# Patient Record
Sex: Male | Born: 1970 | Race: White | Hispanic: No | Marital: Single | Smoking: Current every day smoker
Health system: Southern US, Community
[De-identification: ages and names within clinical notes are randomized; demographics above are authoritative.]

## PROBLEM LIST (undated history)

## (undated) DIAGNOSIS — F329 Major depressive disorder, single episode, unspecified: Secondary | ICD-10-CM

## (undated) DIAGNOSIS — F419 Anxiety disorder, unspecified: Secondary | ICD-10-CM

## (undated) DIAGNOSIS — F319 Bipolar disorder, unspecified: Secondary | ICD-10-CM

## (undated) DIAGNOSIS — F32A Depression, unspecified: Secondary | ICD-10-CM

## (undated) HISTORY — PX: HAND SURGERY: SHX662

## (undated) HISTORY — PX: NECK SURGERY: SHX720

---

## 2014-08-11 ENCOUNTER — Emergency Department (HOSPITAL_COMMUNITY)
Admission: EM | Admit: 2014-08-11 | Discharge: 2014-08-11 | Discharge status: Against Medical Advice | Payer: Self-pay | Attending: Emergency Medicine | Admitting: Emergency Medicine

## 2014-08-11 ENCOUNTER — Encounter (HOSPITAL_COMMUNITY): Payer: Self-pay | Admitting: Emergency Medicine

## 2014-08-11 DIAGNOSIS — F1010 Alcohol abuse, uncomplicated: Secondary | ICD-10-CM | POA: Insufficient documentation

## 2014-08-11 DIAGNOSIS — R45851 Suicidal ideations: Secondary | ICD-10-CM | POA: Insufficient documentation

## 2014-08-11 DIAGNOSIS — F17200 Nicotine dependence, unspecified, uncomplicated: Secondary | ICD-10-CM | POA: Insufficient documentation

## 2014-08-11 LAB — CBC
HCT: 42.4 % (ref 39.0–52.0)
Hemoglobin: 15.1 g/dL (ref 13.0–17.0)
MCH: 30.9 pg (ref 26.0–34.0)
MCHC: 35.6 g/dL (ref 30.0–36.0)
MCV: 86.9 fL (ref 78.0–100.0)
Platelets: 251 10*3/uL (ref 150–400)
RBC: 4.88 MIL/uL (ref 4.22–5.81)
RDW: 12.8 % (ref 11.5–15.5)
WBC: 5.6 10*3/uL (ref 4.0–10.5)

## 2014-08-11 LAB — RAPID URINE DRUG SCREEN, HOSP PERFORMED
Amphetamines: NOT DETECTED
Barbiturates: NOT DETECTED
Benzodiazepines: NOT DETECTED
COCAINE: NOT DETECTED
OPIATES: NOT DETECTED
TETRAHYDROCANNABINOL: NOT DETECTED

## 2014-08-11 LAB — ETHANOL: ALCOHOL ETHYL (B): 353 mg/dL — AB (ref 0–11)

## 2014-08-11 LAB — COMPREHENSIVE METABOLIC PANEL
ALT: 8 U/L (ref 0–53)
AST: 17 U/L (ref 0–37)
Albumin: 4.1 g/dL (ref 3.5–5.2)
Alkaline Phosphatase: 105 U/L (ref 39–117)
Anion gap: 15 (ref 5–15)
BUN: 8 mg/dL (ref 6–23)
CALCIUM: 8.8 mg/dL (ref 8.4–10.5)
CO2: 24 mEq/L (ref 19–32)
CREATININE: 0.95 mg/dL (ref 0.50–1.35)
Chloride: 104 mEq/L (ref 96–112)
GFR calc non Af Amer: >90 mL/min (ref >90)
Glucose, Bld: 98 mg/dL (ref 70–99)
Potassium: 3.9 mEq/L (ref 3.7–5.3)
Sodium: 143 mEq/L (ref 137–147)
TOTAL PROTEIN: 7.7 g/dL (ref 6.0–8.3)
Total Bilirubin: 0.3 mg/dL (ref 0.3–1.2)

## 2014-08-11 NOTE — ED Notes (Signed)
While interviewing pt he stated that he did not want to be here and is not ready to do this  Pt states "I just want to go in the woods and drink myself to death"  Pt repeats he does not want to be here

## 2014-08-11 NOTE — ED Notes (Signed)
Pt not in room at this time

## 2014-08-11 NOTE — ED Notes (Signed)
Pt unable to void at this time. 

## 2014-08-11 NOTE — ED Notes (Signed)
Pt not in room x 3  

## 2014-08-11 NOTE — ED Notes (Signed)
Pt not in room x2 

## 2014-08-11 NOTE — ED Notes (Signed)
Pt states he is an alcoholic and is here for detox  Pt states he has been sober for 90 days and relapsed 3 days ago  Pt states he last drank earlier today

## 2014-08-12 ENCOUNTER — Encounter (HOSPITAL_COMMUNITY): Payer: Self-pay | Admitting: Emergency Medicine

## 2014-08-12 ENCOUNTER — Emergency Department (HOSPITAL_COMMUNITY)
Admission: EM | Admit: 2014-08-12 | Discharge: 2014-08-13 | Disposition: A | Discharge status: Home / Self Care | Payer: Self-pay | Attending: Emergency Medicine | Admitting: Emergency Medicine

## 2014-08-12 DIAGNOSIS — F329 Major depressive disorder, single episode, unspecified: Secondary | ICD-10-CM | POA: Insufficient documentation

## 2014-08-12 DIAGNOSIS — Z79899 Other long term (current) drug therapy: Secondary | ICD-10-CM | POA: Insufficient documentation

## 2014-08-12 DIAGNOSIS — F17200 Nicotine dependence, unspecified, uncomplicated: Secondary | ICD-10-CM | POA: Insufficient documentation

## 2014-08-12 DIAGNOSIS — F39 Unspecified mood [affective] disorder: Secondary | ICD-10-CM | POA: Insufficient documentation

## 2014-08-12 DIAGNOSIS — R45851 Suicidal ideations: Secondary | ICD-10-CM | POA: Insufficient documentation

## 2014-08-12 DIAGNOSIS — F10939 Alcohol use, unspecified with withdrawal, unspecified: Secondary | ICD-10-CM | POA: Insufficient documentation

## 2014-08-12 DIAGNOSIS — Z09 Encounter for follow-up examination after completed treatment for conditions other than malignant neoplasm: Secondary | ICD-10-CM

## 2014-08-12 DIAGNOSIS — Z9289 Personal history of other medical treatment: Secondary | ICD-10-CM

## 2014-08-12 DIAGNOSIS — F10239 Alcohol dependence with withdrawal, unspecified: Secondary | ICD-10-CM | POA: Insufficient documentation

## 2014-08-12 DIAGNOSIS — F411 Generalized anxiety disorder: Secondary | ICD-10-CM | POA: Insufficient documentation

## 2014-08-12 DIAGNOSIS — F3289 Other specified depressive episodes: Secondary | ICD-10-CM | POA: Insufficient documentation

## 2014-08-12 HISTORY — DX: Depression, unspecified: F32.A

## 2014-08-12 HISTORY — DX: Major depressive disorder, single episode, unspecified: F32.9

## 2014-08-12 LAB — COMPREHENSIVE METABOLIC PANEL
ALT: 10 U/L (ref 0–53)
ANION GAP: 18 — AB (ref 5–15)
AST: 21 U/L (ref 0–37)
Albumin: 4.5 g/dL (ref 3.5–5.2)
Alkaline Phosphatase: 117 U/L (ref 39–117)
BUN: 8 mg/dL (ref 6–23)
CALCIUM: 9.3 mg/dL (ref 8.4–10.5)
CO2: 25 meq/L (ref 19–32)
CREATININE: 0.9 mg/dL (ref 0.50–1.35)
Chloride: 102 mEq/L (ref 96–112)
GLUCOSE: 102 mg/dL — AB (ref 70–99)
Potassium: 4 mEq/L (ref 3.7–5.3)
SODIUM: 145 meq/L (ref 137–147)
TOTAL PROTEIN: 8.1 g/dL (ref 6.0–8.3)
Total Bilirubin: 0.4 mg/dL (ref 0.3–1.2)

## 2014-08-12 LAB — CBC
HCT: 45.3 % (ref 39.0–52.0)
HEMOGLOBIN: 16 g/dL (ref 13.0–17.0)
MCH: 31.4 pg (ref 26.0–34.0)
MCHC: 35.3 g/dL (ref 30.0–36.0)
MCV: 89 fL (ref 78.0–100.0)
Platelets: 256 10*3/uL (ref 150–400)
RBC: 5.09 MIL/uL (ref 4.22–5.81)
RDW: 12.9 % (ref 11.5–15.5)
WBC: 6.6 10*3/uL (ref 4.0–10.5)

## 2014-08-12 LAB — RAPID URINE DRUG SCREEN, HOSP PERFORMED
Amphetamines: NOT DETECTED
BARBITURATES: NOT DETECTED
BENZODIAZEPINES: NOT DETECTED
COCAINE: NOT DETECTED
OPIATES: NOT DETECTED
Tetrahydrocannabinol: NOT DETECTED

## 2014-08-12 LAB — ETHANOL: Alcohol, Ethyl (B): 194 mg/dL — ABNORMAL HIGH (ref 0–11)

## 2014-08-12 LAB — ACETAMINOPHEN LEVEL: Acetaminophen (Tylenol), Serum: <15 ug/mL (ref 10–30)

## 2014-08-12 LAB — SALICYLATE LEVEL

## 2014-08-12 MED ORDER — LORAZEPAM 1 MG PO TABS
0.0000 mg | ORAL_TABLET | Freq: Four times a day (QID) | ORAL | Status: DC
  Administered 2014-08-12 (×2): 2 mg via ORAL
  Administered 2014-08-13: 1 mg via ORAL
  Administered 2014-08-13: 2 mg via ORAL
  Administered 2014-08-13: 1 mg via ORAL
  Filled 2014-08-12 (×2): qty 2
  Filled 2014-08-12: qty 1
  Filled 2014-08-12: qty 2
  Filled 2014-08-12: qty 1

## 2014-08-12 MED ORDER — NICOTINE 21 MG/24HR TD PT24
21.0000 mg | MEDICATED_PATCH | Freq: Every day | TRANSDERMAL | Status: DC
  Administered 2014-08-12: 21 mg via TRANSDERMAL
  Filled 2014-08-12: qty 1

## 2014-08-12 MED ORDER — ONDANSETRON 4 MG PO TBDP
8.0000 mg | ORAL_TABLET | ORAL | Status: DC | PRN
  Administered 2014-08-12 (×2): 8 mg via ORAL
  Filled 2014-08-12 (×3): qty 2

## 2014-08-12 MED ORDER — LORAZEPAM 1 MG PO TABS: 0.0000 mg | ORAL_TABLET | Freq: Two times a day (BID) | ORAL | Status: DC

## 2014-08-12 NOTE — ED Notes (Signed)
Edward CheekPeyton Najjar [phone] (minister acquaintance).

## 2014-08-12 NOTE — ED Notes (Signed)
Per pt, seen yesterday for depression and SI.  D/C home.  Pt here today with same.  Had thoughts of hurting self last night.  Roommates told him to come into hospital.  Denies plan

## 2014-08-12 NOTE — ED Notes (Signed)
Patient denies SI, HI, AVH at present. Reports TH, tremor, anxiety, nausea. Rates anxiety 10/10, depression 8/10.  Encouragement offered. Given Ativan, Zofran, Gatorade, Water.  Q 15 safety checks continue.

## 2014-08-12 NOTE — ED Provider Notes (Signed)
CSN: [id number]     Arrival date & time 08/12/14  1523 History   First MD Initiated Contact with Patient 08/12/14 1621     Chief Complaint  Patient presents with  . Suicidal     (Consider location/radiation/quality/duration/timing/severity/associated sxs/prior Treatment) The history is provided by the patient.  pt w hx etoh abuse, anxiety, and depression, c/o worsening depression in the past few weeks. Denies specific stressor. States is also drinking heavier than normal, daily. States when stops drinking will feel shaky. Denies hx complicated etoh withdrawal or dts. States physical health at baseline, no recent illness, or new symptoms or pain. Compliant w normal meds but states he doesn't feel they help. Gets mental health via [facility], but not there recently. States he recently bough duct tape and hose with intent to commit suicide by CO poisoning, however friends intervened before he could follow through on plan.     History reviewed. No pertinent past medical history. Past Surgical History  Procedure Laterality Date  . Neck surgery    . Hand surgery     History reviewed. No pertinent family history. History  Substance Use Topics  . Smoking status: Current Every Day Smoker    Types: Cigarettes  . Smokeless tobacco: Not on file  . Alcohol Use: Yes    Review of Systems  Constitutional: Negative for fever.  HENT: Negative for sore throat.   Eyes: Negative for redness.  Respiratory: Negative for shortness of breath.   Cardiovascular: Negative for chest pain.  Gastrointestinal: Negative for abdominal pain.  Genitourinary: Negative for flank pain.  Musculoskeletal: Negative for back pain and neck pain.  Skin: Negative for rash.  Neurological: Negative for headaches.  Hematological: Does not bruise/bleed easily.  Psychiatric/Behavioral: Positive for suicidal ideas and dysphoric mood. Negative for confusion and self-injury. The patient is nervous/anxious.       Allergies   Review of patient's allergies indicates no known allergies.  Home Medications   Prior to Admission medications   Medication Sig Start Date End Date Taking? Authorizing Provider  lamoTRIgine (LAMICTAL) 150 MG tablet Take 150 mg by mouth daily.   Yes Historical Provider, MD  traZODone (DESYREL) 50 MG tablet Take 50 mg by mouth at bedtime.   Yes Historical Provider, MD   BP 145/87  Pulse 103  Temp(Src) 98.5 F (36.9 C) (Oral)  Resp 18  SpO2 97% Physical Exam  Nursing note and vitals reviewed. Constitutional: He is oriented to person, place, and time. He appears well-developed and well-nourished. No distress.  HENT:  Head: Atraumatic.  Mouth/Throat: Oropharynx is clear and moist.  Eyes: Conjunctivae are normal. Pupils are equal, round, and reactive to light. No scleral icterus.  Neck: Neck supple. No tracheal deviation present.  Cardiovascular: Normal rate, regular rhythm, normal heart sounds and intact distal pulses.   Pulmonary/Chest: Effort normal and breath sounds normal. No accessory muscle usage. No respiratory distress.  Abdominal: Soft. Bowel sounds are normal. He exhibits no distension. There is no tenderness.  Musculoskeletal: Normal range of motion. He exhibits no edema and no tenderness.  Neurological: He is alert and oriented to person, place, and time.  Steady gait, no tremor or shakes.   Skin: Skin is warm and dry. He is not diaphoretic.  Psychiatric:  Depressed mood, +SI w plan.     ED Course  Procedures (including critical care time) Labs Review  Results for orders placed during the hospital encounter of 08/12/14  CBC      Result Value Ref Range  WBC 6.6  4.0 - 10.5 K/uL   RBC 5.09  4.22 - 5.81 MIL/uL   Hemoglobin 16.0  13.0 - 17.0 g/dL   HCT 09.8  11.9 - 14.7 %   MCV 89.0  78.0 - 100.0 fL   MCH 31.4  26.0 - 34.0 pg   MCHC 35.3  30.0 - 36.0 g/dL   RDW 82.9  56.2 - 13.0 %   Platelets 256  150 - 400 K/uL  COMPREHENSIVE METABOLIC PANEL      Result Value  Ref Range   Sodium 145  137 - 147 mEq/L   Potassium 4.0  3.7 - 5.3 mEq/L   Chloride 102  96 - 112 mEq/L   CO2 25  19 - 32 mEq/L   Glucose, Bld 102 (*) 70 - 99 mg/dL   BUN 8  6 - 23 mg/dL   Creatinine, Ser 8.65  0.50 - 1.35 mg/dL   Calcium 9.3  8.4 - 78.4 mg/dL   Total Protein 8.1  6.0 - 8.3 g/dL   Albumin 4.5  3.5 - 5.2 g/dL   AST 21  0 - 37 U/L   ALT 10  0 - 53 U/L   Alkaline Phosphatase 117  39 - 117 U/L   Total Bilirubin 0.4  0.3 - 1.2 mg/dL   GFR calc non Af Amer >90  >90 mL/min   GFR calc Af Amer >90  >90 mL/min   Anion gap 18 (*) 5 - 15  URINE RAPID DRUG SCREEN (HOSP PERFORMED)      Result Value Ref Range   Opiates NONE DETECTED  NONE DETECTED   Cocaine NONE DETECTED  NONE DETECTED   Benzodiazepines NONE DETECTED  NONE DETECTED   Amphetamines NONE DETECTED  NONE DETECTED   Tetrahydrocannabinol NONE DETECTED  NONE DETECTED   Barbiturates NONE DETECTED  NONE DETECTED  ETHANOL      Result Value Ref Range   Alcohol, Ethyl (B) 194 (*) 0 - 11 mg/dL  ACETAMINOPHEN LEVEL      Result Value Ref Range   Acetaminophen (Tylenol), Serum <15.0  10 - 30 ug/mL  SALICYLATE LEVEL      Result Value Ref Range   Salicylate Lvl <2.0 (*) 2.8 - 20.0 mg/dL      MDM  Labs.  Pt moved to psych ED.  dispo per psych team - I anticipate patient will require inpatient psych treatment.  Reviewed nursing notes and prior charts for additional history.   Recheck, pt remains medically stable await psych team eval and dispo.    Suzi Roots, MD 08/12/14 1726

## 2014-08-13 ENCOUNTER — Observation Stay (HOSPITAL_COMMUNITY): Admission: AD | Admit: 2014-08-13 | Payer: Self-pay | Source: Intra-hospital | Admitting: Psychiatry

## 2014-08-13 ENCOUNTER — Encounter (HOSPITAL_COMMUNITY): Payer: Self-pay | Admitting: *Deleted

## 2014-08-13 DIAGNOSIS — Z09 Encounter for follow-up examination after completed treatment for conditions other than malignant neoplasm: Secondary | ICD-10-CM

## 2014-08-13 DIAGNOSIS — Z9289 Personal history of other medical treatment: Secondary | ICD-10-CM

## 2014-08-13 MED ORDER — LAMOTRIGINE 150 MG PO TABS
150.0000 mg | ORAL_TABLET | Freq: Every day | ORAL | Status: DC
  Administered 2014-08-13: 150 mg via ORAL
  Filled 2014-08-13: qty 1

## 2014-08-13 NOTE — ED Notes (Signed)
Update from Emily,CSW, that patient is being reviewed by RTS.

## 2014-08-13 NOTE — BH Assessment (Signed)
[facility] Assessment Progress Note  Pt accepted to [facility] per Lourdes Sledge, Rn. Pt will be transported by [employer] transportation.  Shuvon Rankin, NP agrees with disposition.  Call report number is [phone].

## 2014-08-13 NOTE — BH Assessment (Signed)
Tele Assessment Note   Edward Mcclain is a 43 y.o. male who voluntarily presents to [facility] with SI/SA and depression.  Pt came to emerg dept on 08/11/14 for for detox and stated he didn't receive any help.  Pt says that today he was having thoughts of harming himself and is friends told him to come to the ED for help.  Pt tells this Clinical research associate that he no longer feels SI and is requesting detox.  Pt reports drinking 3-1/5's and 1-12pk of alcohol, daily.  Pt last drink was 08/12/14, he states he consumed 2-24oz beers.  Pt denies seizures/blackouts, he says he has hallucinations during detox.  Pt c/o w/d sxs: tremors and nausea.        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 Depression    Axis IV: other psychosocial or environmental problems, problems related to social environment and problems with primary support group Axis V: 41-50 serious symptoms  Past Medical History:  Past Medical History  Diagnosis Date  . Depression     Past Surgical History  Procedure Laterality Date  . Neck surgery    . Hand surgery      Family History: History reviewed. No pertinent family history.  Social History:  reports that he has been smoking Cigarettes.  He has been smoking about 0.00 packs per day. He does not have any smokeless tobacco history on file. He reports that he drinks alcohol. He reports that he does not use illicit drugs.  Additional Social History:  Alcohol / Drug Use Pain Medications: None  Prescriptions: See MAR  Over the Counter: None  History of alcohol / drug use?: Yes Longest period of sobriety (when/how long): Only when in detox  Negative Consequences of Use: Work / School;Personal relationships;Financial Withdrawal Symptoms: Nausea / Vomiting;Tremors Substance #1 Name of Substance 1: Alcohol  1 - Age of First Use: 12 YOM  1 - Amount (size/oz): 3-1/5's & 1-12pk  1 - Frequency: Daily  1 -  Duration: On-going  1 - Last Use / Amount: 08/12/14  CIWA: CIWA-Ar BP: 130/84 mmHg Pulse Rate: 91 Nausea and Vomiting: 3 Tactile Disturbances: none Tremor: three Auditory Disturbances: not present Paroxysmal Sweats: three Visual Disturbances: not present Anxiety: two Headache, Fullness in Head: none present Agitation: somewhat more than normal activity Orientation and Clouding of Sensorium: oriented and can do serial additions CIWA-Ar Total: 12 COWS:    PATIENT STRENGTHS: (choose at least two) Communication skills Motivation for treatment/growth  Allergies: No Known Allergies  Home Medications:  (Not in a hospital admission)  OB/GYN Status:  No LMP for male patient.  General Assessment Data Location of Assessment: [facility] Is this a Tele or Face-to-Face Assessment?: Face-to-Face Is this an Initial Assessment or a Re-assessment for this encounter?: Initial Assessment Living Arrangements: Non-relatives/Friends (Lives with roommates ) Can pt return to current living arrangement?: Yes Admission Status: Voluntary Is patient capable of signing voluntary admission?: Yes Transfer from: Acute Hospital Referral Source: MD  Medical Screening Exam [facility] Walk-in ONLY) Medical Exam completed: No Reason for MSE not completed: Other: (None )  [facility] Crisis Care Plan Living Arrangements: Non-relatives/Friends (Lives with roommates ) Name of Psychiatrist: None  Name of Therapist: None   Education Status Is patient currently in school?: No Current Grade: None Highest grade of school patient has completed: None  Name of school: None  Contact person: None   Risk to self with  the past 6 months Suicidal Ideation: No-Not Currently/Within Last 6 Months Suicidal Intent: No-Not Currently/Within Last 6 Months Is patient at risk for suicide?: No Suicidal Plan?: No-Not Currently/Within Last 6 Months Access to Means: Yes Specify Access to Suicidal Means: Sharps, Hose(CO2 poisoning) What has  been your use of drugs/alcohol within the last 12 months?: Abusing: alcohol  Previous Attempts/Gestures: No How many times?: 0 Other Self Harm Risks: None  Triggers for Past Attempts: None known Intentional Self Injurious Behavior: None Family Suicide History: No Recent stressful life event(s): Other (Comment) (Chronic alcohol use ) Persecutory voices/beliefs?: No Depression: Yes Depression Symptoms: Loss of interest in usual pleasures Substance abuse history and/or treatment for substance abuse?: Yes Suicide prevention information given to non-admitted patients: Not applicable  Risk to Others within the past 6 months Homicidal Ideation: No Thoughts of Harm to Others: No Current Homicidal Intent: No Current Homicidal Plan: No Access to Homicidal Means: No Identified Victim: None  History of harm to others?: No Assessment of Violence: None Noted Violent Behavior Description: None  Does patient have access to weapons?: No Criminal Charges Pending?: No Does patient have a court date: No  Psychosis Hallucinations: None noted Delusions: None noted  Mental Status Report Appear/Hygiene: Disheveled;In scrubs Eye Contact: Fair Motor Activity: Tremors Speech: Logical/coherent Level of Consciousness: Alert Mood: Depressed Affect: Depressed Anxiety Level: None Thought Processes: Coherent;Relevant Judgement: Unimpaired Orientation: Person;Place;Time;Situation Obsessive Compulsive Thoughts/Behaviors: None  Cognitive Functioning Concentration: Normal Memory: Recent Intact;Remote Intact IQ: Average Insight: Fair Impulse Control: Fair Appetite: Fair Weight Loss: 0 Weight Gain: 0 Sleep: Decreased Total Hours of Sleep: 5 Vegetative Symptoms: None  ADLScreening [facility] Assessment Services) Patient's cognitive ability adequate to safely complete daily activities?: Yes Patient able to express need for assistance with ADLs?: Yes Independently performs ADLs?: Yes (appropriate for  developmental age)  Prior Inpatient Therapy Prior Inpatient Therapy: Yes Prior Therapy Dates: 30's, 2000, 2015 Prior Therapy Facilty/Provider(s): [facility], [facility], [facility]  Reason for Treatment: Detox/Rehab   Prior Outpatient Therapy Prior Outpatient Therapy: No Prior Therapy Dates: None  Prior Therapy Facilty/Provider(s): None  Reason for Treatment: None   ADL Screening (condition at time of admission) Patient's cognitive ability adequate to safely complete daily activities?: Yes Is the patient deaf or have difficulty hearing?: No Does the patient have difficulty seeing, even when wearing glasses/contacts?: No Does the patient have difficulty concentrating, remembering, or making decisions?: No Patient able to express need for assistance with ADLs?: Yes Does the patient have difficulty dressing or bathing?: No Independently performs ADLs?: Yes (appropriate for developmental age) Does the patient have difficulty walking or climbing stairs?: No Weakness of Legs: None Weakness of Arms/Hands: None  Home Assistive Devices/Equipment Home Assistive Devices/Equipment: None  Therapy Consults (therapy consults require a physician order) PT Evaluation Needed: No OT Evalulation Needed: No SLP Evaluation Needed: No Abuse/Neglect Assessment (Assessment to be complete while patient is alone) Physical Abuse: Denies Verbal Abuse: Denies Sexual Abuse: Denies Exploitation of patient/patient's resources: Denies Self-Neglect: Denies Values / Beliefs Cultural Requests During Hospitalization: None Spiritual Requests During Hospitalization: None Consults Spiritual Care Consult Needed: No Social Work Consult Needed: No Merchant navy officer (For Healthcare) Does patient have an advance directive?: No Would patient like information on creating an advanced directive?: No - patient declined information Nutrition Screen- [facility] Adult/[facility] Patient's home diet: Regular  Additional  Information 1:1 In Past 12 Months?: No CIRT Risk: No Elopement Risk: No Does patient have medical clearance?: Yes     Disposition:  Disposition Initial Assessment Completed  for this Encounter: Yes Disposition of Patient: Inpatient treatment program (No beds avail.  TTS will need to seek placement ) Type of inpatient treatment program: Adult Patient referred to: Other (Comment) (No beds avail.  TSS will need to seek placement )  Murrell Redden 08/13/2014 4:44 AM

## 2014-08-13 NOTE — Consult Note (Signed)
  Patient states that he wants help with detox from alcohol.  Patient states that he drinks fifth [place] Rose (4 bottles a day).  Patient denies history of seizures, suicidal/homicidal ideation, psychosis, and paranoia.  Patient states that his last drink was  Yesterday morning.    Agree with TTS assessment Recommend 24 hour observation  Patient was accepted to [facility] Observation Unit Bed 5  Monitor safety and stabilization until transfer.  Shuvon B. Rankin FNP-BC

## 2014-08-13 NOTE — Progress Notes (Signed)
[facility] Community Liaison Stacy,  ° °Provided pt with a [facility] Orange Card application, highlighting [facility], to help patient establish primary care.  °

## 2014-08-19 NOTE — Consult Note (Signed)
Face to face evaluation and I agree with this note 

## 2014-10-27 ENCOUNTER — Emergency Department (HOSPITAL_COMMUNITY)
Admission: EM | Admit: 2014-10-27 | Discharge: 2014-10-28 | Disposition: A | Discharge status: Other Acute Inpatient Hospital | Payer: Self-pay | Attending: Emergency Medicine | Admitting: Emergency Medicine

## 2014-10-27 DIAGNOSIS — F10920 Alcohol use, unspecified with intoxication, uncomplicated: Secondary | ICD-10-CM | POA: Insufficient documentation

## 2014-10-27 DIAGNOSIS — F329 Major depressive disorder, single episode, unspecified: Secondary | ICD-10-CM | POA: Insufficient documentation

## 2014-10-27 DIAGNOSIS — F1010 Alcohol abuse, uncomplicated: Secondary | ICD-10-CM

## 2014-10-27 DIAGNOSIS — Z79899 Other long term (current) drug therapy: Secondary | ICD-10-CM | POA: Insufficient documentation

## 2014-10-27 MED ORDER — NICOTINE 21 MG/24HR TD PT24
21.0000 mg | MEDICATED_PATCH | Freq: Once | TRANSDERMAL | Status: DC
  Administered 2014-10-28: 21 mg via TRANSDERMAL
  Filled 2014-10-27: qty 1

## 2014-10-27 NOTE — ED Notes (Signed)
Pt ambulatory to bathroom without assistance.

## 2014-10-27 NOTE — ED Notes (Signed)
Unable to verify any information due to pt's intoxication status

## 2014-10-27 NOTE — ED Provider Notes (Signed)
CSN: [id number]     Arrival date & time 10/27/14  1910 History   First MD Initiated Contact with Patient 10/27/14 2229     Chief Complaint  Patient presents with  . Alcohol Intoxication   (Consider location/radiation/quality/duration/timing/severity/associated sxs/prior Treatment) HPI  Edward Mcclain is a 43 yo male requesting detox from ETOH.  He reports he lives at a halfway house and because of his alcohol use he was told he can no longer live there unless he is detoxed.  He reports drinking a case of beer daily for "years".  His last drink was today a few hours PTA.  He last went through a 28 day detox program 5 months ago but he began drinking again shortly after release from that program. He reports smoking a pack of cigarettes a day but denies any recreational drug use.  He denies any SI/HI and denies any fevers, nausea, vomiting, chest pain, cough or shortness of breath   Past Medical History  Diagnosis Date  . Depression    Past Surgical History  Procedure Laterality Date  . Neck surgery    . Hand surgery     No family history on file. History  Substance Use Topics  . Smoking status: Current Every Day Smoker    Types: Cigarettes  . Smokeless tobacco: Not on file  . Alcohol Use: Yes     Comment: Daily Use     Review of Systems  Constitutional: Negative for fever and chills.  HENT: Negative for sore throat.   Eyes: Negative for visual disturbance.  Respiratory: Negative for cough and shortness of breath.   Cardiovascular: Negative for chest pain and leg swelling.  Gastrointestinal: Negative for nausea, vomiting and diarrhea.  Genitourinary: Negative for dysuria.  Musculoskeletal: Negative for myalgias.  Skin: Negative for rash.  Neurological: Negative for weakness, numbness and headaches.    Allergies  Review of patient's allergies indicates no known allergies.  Home Medications   Prior to Admission medications   Medication Sig Start Date End Date Taking?  Authorizing Provider  lamoTRIgine (LAMICTAL) 100 MG tablet Take 100 mg by mouth daily.   Yes Historical Provider, MD  traZODone (DESYREL) 150 MG tablet Take by mouth at bedtime.   Yes Historical Provider, MD   BP 139/94 mmHg  Pulse 104  Temp(Src) 98.1 F (36.7 C) (Oral)  Resp 20  SpO2 99% Physical Exam  Constitutional: He appears well-developed and well-nourished. No distress.  HENT:  Head: Normocephalic and atraumatic.  Mouth/Throat: Oropharynx is clear and moist. No oropharyngeal exudate.  Eyes: Pupils are equal, round, and reactive to light. Right conjunctiva is injected. Left conjunctiva is injected.  Neck: Neck supple. No thyromegaly present.  Cardiovascular: Normal rate, regular rhythm and intact distal pulses.   Pulmonary/Chest: Effort normal and breath sounds normal. No respiratory distress. He has no wheezes. He has no rales. He exhibits no tenderness.  Abdominal: Soft. There is no tenderness.  Musculoskeletal: He exhibits no tenderness.  Lymphadenopathy:    He has no cervical adenopathy.  Neurological: He is alert.  Skin: Skin is warm and dry. No rash noted. He is not diaphoretic.  Psychiatric: He has a normal mood and affect.  Nursing note and vitals reviewed.   ED Course  Procedures (including critical care time) Labs Review Labs Reviewed  CBC WITH DIFFERENTIAL - Abnormal; Notable for the following:    Hemoglobin 17.6 (*)    Neutrophils Relative % 32 (*)    Lymphocytes Relative 57 (*)    All  other components within normal limits  URINE RAPID DRUG SCREEN (HOSP PERFORMED)  COMPREHENSIVE METABOLIC PANEL  ETHANOL  ACETAMINOPHEN LEVEL  SALICYLATE LEVEL   Imaging Review No results found.   EKG Interpretation None      MDM   Final diagnoses:  ETOH abuse   Pt is alert and oriented, his speech is slightly slurred and there is the odor of ETOH, but he can answer questions appropriately, speak in full sentences and walk in the ED and is requesting ETOH detox.    Patient has been medically cleared in the ED and is awaiting consult by TTS team for possible placement vs OP clinic information. Pt is currently not having SI or HI and appears stable in NAD. [facility]   Filed Vitals:   10/27/14 1945 10/27/14 2235  BP: 152/106 139/94  Pulse: 106 104  Temp: 98.1 F (36.7 C)   TempSrc: Oral   Resp: 20   SpO2: 100% 99%   Meds given in ED:  Medications  nicotine (NICODERM CQ - dosed in mg/24 hours) patch 21 mg (21 mg Transdermal Patch Applied 10/28/14 0003)  acetaminophen (TYLENOL) tablet 650 mg (not administered)  ibuprofen (ADVIL,MOTRIN) tablet 600 mg (not administered)  ondansetron (ZOFRAN) tablet 4 mg (not administered)  zolpidem (AMBIEN) tablet 5 mg (not administered)  LORazepam (ATIVAN) tablet 1 mg (not administered)    New Prescriptions   No medications on file        Harle BattiestElizabeth Madisun Hargrove, NP 10/28/14 0041  Arby BarretteMarcy Pfeiffer, MD 10/28/14 [phone]

## 2014-10-27 NOTE — ED Notes (Signed)
Pt is intoxicated  Pt states he does not know how he got here or why he is here  When asked what we could do for him tonight pt laughed and said "I don't know"

## 2014-10-28 ENCOUNTER — Observation Stay (HOSPITAL_COMMUNITY)
Admission: AD | Admit: 2014-10-28 | Discharge: 2014-10-29 | Disposition: A | Discharge status: Home / Self Care | Payer: Self-pay | Source: Intra-hospital | Attending: Psychiatry | Admitting: Psychiatry

## 2014-10-28 ENCOUNTER — Encounter (HOSPITAL_COMMUNITY): Payer: Self-pay | Admitting: *Deleted

## 2014-10-28 DIAGNOSIS — F10239 Alcohol dependence with withdrawal, unspecified: Principal | ICD-10-CM | POA: Insufficient documentation

## 2014-10-28 DIAGNOSIS — Z09 Encounter for follow-up examination after completed treatment for conditions other than malignant neoplasm: Secondary | ICD-10-CM

## 2014-10-28 DIAGNOSIS — F10929 Alcohol use, unspecified with intoxication, unspecified: Secondary | ICD-10-CM

## 2014-10-28 DIAGNOSIS — Z79899 Other long term (current) drug therapy: Secondary | ICD-10-CM | POA: Insufficient documentation

## 2014-10-28 DIAGNOSIS — F319 Bipolar disorder, unspecified: Secondary | ICD-10-CM | POA: Insufficient documentation

## 2014-10-28 DIAGNOSIS — Z9289 Personal history of other medical treatment: Secondary | ICD-10-CM

## 2014-10-28 DIAGNOSIS — F10920 Alcohol use, unspecified with intoxication, uncomplicated: Secondary | ICD-10-CM

## 2014-10-28 DIAGNOSIS — F10230 Alcohol dependence with withdrawal, uncomplicated: Secondary | ICD-10-CM | POA: Insufficient documentation

## 2014-10-28 DIAGNOSIS — F419 Anxiety disorder, unspecified: Secondary | ICD-10-CM | POA: Insufficient documentation

## 2014-10-28 DIAGNOSIS — F1020 Alcohol dependence, uncomplicated: Secondary | ICD-10-CM | POA: Diagnosis present

## 2014-10-28 HISTORY — DX: Bipolar disorder, unspecified: F31.9

## 2014-10-28 HISTORY — DX: Anxiety disorder, unspecified: F41.9

## 2014-10-28 LAB — ACETAMINOPHEN LEVEL: Acetaminophen (Tylenol), Serum: <15 ug/mL (ref 10–30)

## 2014-10-28 LAB — CBC WITH DIFFERENTIAL/PLATELET
Basophils Absolute: 0 10*3/uL (ref 0.0–0.1)
Basophils Relative: 1 % (ref 0–1)
Eosinophils Absolute: 0.2 10*3/uL (ref 0.0–0.7)
Eosinophils Relative: 4 % (ref 0–5)
HCT: 50.1 % (ref 39.0–52.0)
HEMOGLOBIN: 17.6 g/dL — AB (ref 13.0–17.0)
LYMPHS ABS: 3.8 10*3/uL (ref 0.7–4.0)
Lymphocytes Relative: 57 % — ABNORMAL HIGH (ref 12–46)
MCH: 31.6 pg (ref 26.0–34.0)
MCHC: 35.1 g/dL (ref 30.0–36.0)
MCV: 89.9 fL (ref 78.0–100.0)
MONOS PCT: 6 % (ref 3–12)
Monocytes Absolute: 0.4 10*3/uL (ref 0.1–1.0)
NEUTROS PCT: 32 % — AB (ref 43–77)
Neutro Abs: 2.1 10*3/uL (ref 1.7–7.7)
Platelets: 359 10*3/uL (ref 150–400)
RBC: 5.57 MIL/uL (ref 4.22–5.81)
RDW: 12.9 % (ref 11.5–15.5)
WBC: 6.6 10*3/uL (ref 4.0–10.5)

## 2014-10-28 LAB — COMPREHENSIVE METABOLIC PANEL
ALK PHOS: 102 U/L (ref 39–117)
ALT: 13 U/L (ref 0–53)
ANION GAP: 15 (ref 5–15)
AST: 21 U/L (ref 0–37)
Albumin: 4.4 g/dL (ref 3.5–5.2)
BUN: 7 mg/dL (ref 6–23)
CHLORIDE: 106 meq/L (ref 96–112)
CO2: 29 mEq/L (ref 19–32)
Calcium: 9.4 mg/dL (ref 8.4–10.5)
Creatinine, Ser: 0.91 mg/dL (ref 0.50–1.35)
GLUCOSE: 111 mg/dL — AB (ref 70–99)
POTASSIUM: 4.2 meq/L (ref 3.7–5.3)
Sodium: 150 mEq/L — ABNORMAL HIGH (ref 137–147)
Total Bilirubin: 0.3 mg/dL (ref 0.3–1.2)
Total Protein: 8.5 g/dL — ABNORMAL HIGH (ref 6.0–8.3)

## 2014-10-28 LAB — RAPID URINE DRUG SCREEN, HOSP PERFORMED
Amphetamines: NOT DETECTED
Barbiturates: NOT DETECTED
Benzodiazepines: NOT DETECTED
Cocaine: NOT DETECTED
Opiates: NOT DETECTED
Tetrahydrocannabinol: NOT DETECTED

## 2014-10-28 LAB — ETHANOL
ALCOHOL ETHYL (B): 219 mg/dL — AB (ref 0–11)
Alcohol, Ethyl (B): 399 mg/dL — ABNORMAL HIGH (ref 0–11)

## 2014-10-28 LAB — SALICYLATE LEVEL

## 2014-10-28 MED ORDER — CHLORDIAZEPOXIDE HCL 25 MG PO CAPS: 25.0000 mg | ORAL_CAPSULE | Freq: Four times a day (QID) | ORAL | Status: DC | PRN

## 2014-10-28 MED ORDER — CHLORDIAZEPOXIDE HCL 25 MG PO CAPS
25.0000 mg | ORAL_CAPSULE | Freq: Four times a day (QID) | ORAL | Status: DC | PRN
  Administered 2014-10-28: 25 mg via ORAL
  Filled 2014-10-28: qty 1

## 2014-10-28 MED ORDER — CHLORDIAZEPOXIDE HCL 25 MG PO CAPS: 25.0000 mg | ORAL_CAPSULE | Freq: Four times a day (QID) | ORAL | Status: DC

## 2014-10-28 MED ORDER — ZOLPIDEM TARTRATE 5 MG PO TABS: 5.0000 mg | ORAL_TABLET | Freq: Every evening | ORAL | Status: DC | PRN

## 2014-10-28 MED ORDER — VITAMIN B-1 100 MG PO TABS
100.0000 mg | ORAL_TABLET | Freq: Every day | ORAL | Status: DC
  Administered 2014-10-29: 100 mg via ORAL
  Filled 2014-10-28 (×3): qty 1

## 2014-10-28 MED ORDER — ADULT MULTIVITAMIN W/MINERALS CH: 1.0000 | ORAL_TABLET | Freq: Every day | ORAL | Status: DC

## 2014-10-28 MED ORDER — LORAZEPAM 1 MG PO TABS
1.0000 mg | ORAL_TABLET | Freq: Three times a day (TID) | ORAL | Status: DC | PRN
  Administered 2014-10-28: 1 mg via ORAL
  Filled 2014-10-28: qty 1

## 2014-10-28 MED ORDER — SODIUM CHLORIDE 0.9 % IV BOLUS (SEPSIS)
1000.0000 mL | Freq: Once | INTRAVENOUS | Status: AC
  Administered 2014-10-28: 1000 mL via INTRAVENOUS

## 2014-10-28 MED ORDER — CHLORDIAZEPOXIDE HCL 25 MG PO CAPS: 25.0000 mg | ORAL_CAPSULE | Freq: Every day | ORAL | Status: DC

## 2014-10-28 MED ORDER — THIAMINE HCL 100 MG/ML IJ SOLN: 100.0000 mg | Freq: Once | INTRAMUSCULAR | Status: DC

## 2014-10-28 MED ORDER — ACETAMINOPHEN 325 MG PO TABS: 650.0000 mg | ORAL_TABLET | Freq: Four times a day (QID) | ORAL | Status: DC | PRN

## 2014-10-28 MED ORDER — HALOPERIDOL 5 MG PO TABS: 5.0000 mg | ORAL_TABLET | Freq: Four times a day (QID) | ORAL | Status: DC | PRN

## 2014-10-28 MED ORDER — LORAZEPAM 2 MG/ML IJ SOLN: 0.0000 mg | Freq: Four times a day (QID) | INTRAMUSCULAR | Status: DC

## 2014-10-28 MED ORDER — HYDROXYZINE HCL 25 MG PO TABS: 25.0000 mg | ORAL_TABLET | Freq: Four times a day (QID) | ORAL | Status: DC | PRN

## 2014-10-28 MED ORDER — ONDANSETRON 4 MG PO TBDP: 4.0000 mg | ORAL_TABLET | Freq: Four times a day (QID) | ORAL | Status: DC | PRN

## 2014-10-28 MED ORDER — ONDANSETRON 4 MG PO TBDP
4.0000 mg | ORAL_TABLET | Freq: Four times a day (QID) | ORAL | Status: DC | PRN
  Administered 2014-10-28: 4 mg via ORAL
  Filled 2014-10-28: qty 1

## 2014-10-28 MED ORDER — CHLORDIAZEPOXIDE HCL 25 MG PO CAPS
25.0000 mg | ORAL_CAPSULE | Freq: Three times a day (TID) | ORAL | Status: DC
Start: 2014-10-30 — End: 2014-10-30

## 2014-10-28 MED ORDER — ACETAMINOPHEN 325 MG PO TABS: 650.0000 mg | ORAL_TABLET | ORAL | Status: DC | PRN

## 2014-10-28 MED ORDER — CHLORDIAZEPOXIDE HCL 25 MG PO CAPS: 25.0000 mg | ORAL_CAPSULE | Freq: Three times a day (TID) | ORAL | Status: DC

## 2014-10-28 MED ORDER — CHLORDIAZEPOXIDE HCL 25 MG PO CAPS
25.0000 mg | ORAL_CAPSULE | Freq: Four times a day (QID) | ORAL | Status: AC
  Administered 2014-10-28 – 2014-10-29 (×5): 25 mg via ORAL
  Filled 2014-10-28 (×4): qty 1

## 2014-10-28 MED ORDER — THIAMINE HCL 100 MG/ML IJ SOLN
Freq: Once | INTRAVENOUS | Status: AC
  Administered 2014-10-28: 03:00:00 via INTRAVENOUS
  Filled 2014-10-28: qty 1000

## 2014-10-28 MED ORDER — BENZTROPINE MESYLATE 1 MG PO TABS: 1.0000 mg | ORAL_TABLET | Freq: Four times a day (QID) | ORAL | Status: DC | PRN

## 2014-10-28 MED ORDER — CHLORDIAZEPOXIDE HCL 25 MG PO CAPS: 25.0000 mg | ORAL_CAPSULE | ORAL | Status: DC

## 2014-10-28 MED ORDER — ONDANSETRON HCL 4 MG PO TABS: 4.0000 mg | ORAL_TABLET | Freq: Three times a day (TID) | ORAL | Status: DC | PRN

## 2014-10-28 MED ORDER — TRAZODONE HCL 50 MG PO TABS
50.0000 mg | ORAL_TABLET | Freq: Every evening | ORAL | Status: DC | PRN
  Administered 2014-10-28: 50 mg via ORAL
  Filled 2014-10-28 (×5): qty 1

## 2014-10-28 MED ORDER — LAMOTRIGINE 100 MG PO TABS
150.0000 mg | ORAL_TABLET | Freq: Every day | ORAL | Status: DC
  Administered 2014-10-28 – 2014-10-29 (×2): 150 mg via ORAL
  Filled 2014-10-28 (×2): qty 1.5
  Filled 2014-10-28: qty 21
  Filled 2014-10-28 (×2): qty 2
  Filled 2014-10-28: qty 1.5

## 2014-10-28 MED ORDER — LORAZEPAM 2 MG/ML IJ SOLN: 0.0000 mg | Freq: Two times a day (BID) | INTRAMUSCULAR | Status: DC

## 2014-10-28 MED ORDER — ALUM & MAG HYDROXIDE-SIMETH 200-200-20 MG/5ML PO SUSP: 30.0000 mL | ORAL | Status: DC | PRN

## 2014-10-28 MED ORDER — LOPERAMIDE HCL 2 MG PO CAPS: 2.0000 mg | ORAL_CAPSULE | ORAL | Status: DC | PRN

## 2014-10-28 MED ORDER — CHLORDIAZEPOXIDE HCL 25 MG PO CAPS
25.0000 mg | ORAL_CAPSULE | Freq: Every day | ORAL | Status: DC
Start: 2014-11-01 — End: 2014-10-28

## 2014-10-28 MED ORDER — HYDROXYZINE HCL 25 MG PO TABS
25.0000 mg | ORAL_TABLET | Freq: Four times a day (QID) | ORAL | Status: DC | PRN
  Administered 2014-10-28: 25 mg via ORAL
  Filled 2014-10-28: qty 1

## 2014-10-28 MED ORDER — VITAMIN B-1 100 MG PO TABS: 100.0000 mg | ORAL_TABLET | Freq: Every day | ORAL | Status: DC

## 2014-10-28 MED ORDER — MAGNESIUM HYDROXIDE 400 MG/5ML PO SUSP: 30.0000 mL | Freq: Every day | ORAL | Status: DC | PRN

## 2014-10-28 MED ORDER — IBUPROFEN 200 MG PO TABS: 600.0000 mg | ORAL_TABLET | Freq: Three times a day (TID) | ORAL | Status: DC | PRN

## 2014-10-28 MED ORDER — ADULT MULTIVITAMIN W/MINERALS CH
1.0000 | ORAL_TABLET | Freq: Every day | ORAL | Status: DC
  Administered 2014-10-28 – 2014-10-29 (×2): 1 via ORAL
  Filled 2014-10-28: qty 14
  Filled 2014-10-28 (×5): qty 1

## 2014-10-28 MED ORDER — NICOTINE 21 MG/24HR TD PT24
21.0000 mg | MEDICATED_PATCH | Freq: Every day | TRANSDERMAL | Status: DC
  Administered 2014-10-28 – 2014-10-29 (×2): 21 mg via TRANSDERMAL
  Filled 2014-10-28 (×6): qty 1

## 2014-10-28 NOTE — Progress Notes (Signed)
Patient ID: Edward HeadsRobert Mcclain, male   DOB: May 31, 1971, 43 y.o.   MRN: [id number] D: Patient alert and cooperative. Pt detoxing from EOTH and c/o tremors, nausea, and anxiety. Pt mood/affect is anxious and sad. Pt denies SI/HI/AVH. No acute distressed noted at this time.   A: Medications administered as prescribed. Emotional support given and will continue to monitor pt's progress for stabilization.  R: Patient remains safe and complaint with medications.

## 2014-10-28 NOTE — Progress Notes (Signed)
Patient ID: Edward HeadsRobert Mcclain, male   DOB: 06/26/1971, 43 y.o.   MRN: [id number] 43 year old Caucasian [facility].  Patient states he has been drinking since the age of 43 with his longest period of sobriety being 14 months.  [facility] and was asked to leave when they discovered he had been drinking.  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  Patient had lab work done prior to coming over and his alcohol level was still elevated.  He smelled quite strongly of alcohol.  He was cooperative with the interview process.  He is not married, has no children, but states his parents and siblings are his support systems.  Is familiar with AA and has been to some meetings in the past, but never had a sponsor.  Admits that when he has tried to stop drinking on his own he has a rough time with sweats and shaking.  He would like to detox and go back to his sober living home.

## 2014-10-28 NOTE — ED Provider Notes (Signed)
Called regarding patient's sodium level.  Behavioral Health [facility]  His hypernatremia was likely secondary to dehydration.  He has had IV fluids and is tolerating PO intake since that test was initially drawn.  I think he is stable for further psychiatric care.     Warnell Foresterrey Felisa Zechman, MD 10/28/14 1153

## 2014-10-28 NOTE — ED Provider Notes (Signed)
Edward Mcclain with [facility] contacted the ED in regards to Mr. Edward Mcclain with a concern for extremely elevated alcohol level and history of DTs, seizures from alcohol withdrawal. The patient was reporting symptoms of restlessness and irritability and expressed concerns that this is how withdrawal felt to him.   The patient will be placed on CIWA protocol and kept in [facility]. IV fluids provided. Will observe for any sign of withdrawal while ETOH level decreases. Per Edward Mcclain, [facility] detox will require ETOH level of 160 or less. Will redraw level in a.m.   When patient is stable and ETOH level decreased, will need to re-consult TCC for assistance with placement.   Edward HookerShari A Tymothy Cass, PA-C 10/28/14 0455  Edward Raceravid Yelverton, MD 10/28/14 [id number]

## 2014-10-28 NOTE — ED Notes (Signed)
Black duffle bag and 1 bag of [facility] belongings [facility]

## 2014-10-28 NOTE — Consult Note (Signed)
[facility] Face-to-Face Psychiatry Consult   Reason for Consult: "I need alcohol detox.'' Referring Physician:  EDP Edward Mcclain is an 43 y.o. male. Total Time spent with patient: 45 minutes  Assessment: AXIS I:  Alcohol use with uncomplicated intoxication  AXIS II:  Deferred AXIS III:   Past Medical History  Diagnosis Date  . Depression    AXIS IV:  housing problems, other psychosocial or environmental problems and problems related to social environment AXIS V:  51-60 moderate symptoms  Plan:  No evidence of imminent risk to self or others at present.   Recommends [facility] OBS unit   Subjective:   Edward Mcclain is a 43 y.o. male patient admitted with alcohol intoxication.  HPI: Patient  Reports long history of alcohol abuse dating back to age 11, he is requesting for ETOH detox. He says that [facility] (like [facility]) and was found out to have been drinking. It is against the rules to be drinking while there so he has been asked to leave until he goes through detox. Patient reports history of detoxification, blackouts and severe tremors, he denies prior history of seizure disorder relating to alcohol use. His longest period of sobriety is 3 months. Patient was in detox at a facility in [place] 3 months ago, he was sober for about two weeks before he started drinking again. He has been drinking a gallon per day of Wild Irish Rose. Last drink was around 16:00 on 11/16. Pt denies any SI, HI or A/V hallucinations. However, he admits to being depressed about his relapses. He reports OCD behavior like checking his zipper frequently, etc Reports that the OCD and anxiety cause him to drink a lot. Patient was at [facility] a few months ago for rehab. Patient is seen for psychiatric meds at [facility] for the last 6 months but was never compliant. Pt BAL was 399 at 00:04 on 11/17.  HPI Elements:   Location:  alcohol dependence, anxiety. Quality:   severe. Duration:  since age 11. Context:  life stressor, non compliant with medications.  Past Psychiatric History: Past Medical History  Diagnosis Date  . Depression     reports that he has been smoking Cigarettes.  He has been smoking about 0.00 packs per day. He does not have any smokeless tobacco history on file. He reports that he drinks alcohol. He reports that he does not use illicit drugs. No family history on file. Family History Substance Abuse: Yes, Describe: (Men on mother's side drank a lot.) Family Supports: Yes, List: (Parents, sister) Living Arrangements: Non-relatives/Friends ([facility]idential tx house [name] Can pt return to current living arrangement?: Yes (Once he gets sober & detoxed.) Abuse/Neglect ([facility]) Physical Abuse: Denies Verbal Abuse: Denies Sexual Abuse: Denies Allergies:  No Known Allergies  ACT Assessment Complete:  Yes:    Educational Status    Risk to Self: Risk to self with the past 6 months Suicidal Ideation: No Suicidal Intent: No Is patient at risk for suicide?: No Suicidal Plan?: No Access to Means: No What has been your use of drugs/alcohol within the last 12 months?: ETOH daily use Previous Attempts/Gestures: Yes How many times?: 1 Other Self Harm Risks: N/A Triggers for Past Attempts: None known Intentional Self Injurious Behavior: None Family Suicide History: No Recent stressful life event(s): Other (Comment) (No identified stressors.) Persecutory voices/beliefs?: No Depression: Yes Depression Symptoms: Despondent, Insomnia, Guilt, Loss of interest in usual pleasures, Feeling worthless/self pity Substance abuse history   and/or treatment for substance abuse?: Yes Suicide prevention information given to non-admitted patients: Not applicable  Risk to Others: Risk to Others within the past 6 months Homicidal Ideation: No Thoughts of Harm to Others: No Current Homicidal Intent: No Current Homicidal Plan: No Access to  Homicidal Means: No Identified Victim: No one History of harm to others?: No Assessment of Violence: In distant past Violent Behavior Description: Pt cooperative Does patient have access to weapons?: No Criminal Charges Pending?: No Does patient have a court date: No  Abuse: Abuse/Neglect Assessment (Assessment to be complete while patient is alone) Physical Abuse: Denies Verbal Abuse: Denies Sexual Abuse: Denies Exploitation of patient/patient's resources: Denies Self-Neglect: Denies  Prior Inpatient Therapy: Prior Inpatient Therapy Prior Inpatient Therapy: Yes Prior Therapy Dates: "Few months ago." Prior Therapy Facilty/Provider(s): [facility] Reason for Treatment: Rehab  Prior Outpatient Therapy: Prior Outpatient Therapy Prior Outpatient Therapy: Yes Prior Therapy Dates:  (Last 6 months) Prior Therapy Facilty/Provider(s): [facility] Reason for Treatment: psych meds  Additional Information: Additional Information 1:1 In Past 12 Months?: No CIRT Risk: No Elopement Risk: No Does patient have medical clearance?: Yes                  Objective: Blood pressure 114/64, pulse 94, temperature 97.5 F (36.4 C), temperature source Oral, resp. rate 16, SpO2 94 %.There is no height or weight on file to calculate BMI. Results for orders placed or performed during the hospital encounter of 10/27/14 (from the past 72 hour(s))  Drug screen panel, emergency     Status: None   Collection Time: 10/28/14 12:02 AM  Result Value Ref Range   Opiates NONE DETECTED NONE DETECTED   Cocaine NONE DETECTED NONE DETECTED   Benzodiazepines NONE DETECTED NONE DETECTED   Amphetamines NONE DETECTED NONE DETECTED   Tetrahydrocannabinol NONE DETECTED NONE DETECTED   Barbiturates NONE DETECTED NONE DETECTED    Comment:        DRUG SCREEN FOR MEDICAL PURPOSES ONLY.  IF CONFIRMATION IS NEEDED FOR ANY PURPOSE, NOTIFY LAB WITHIN 5 DAYS.        LOWEST DETECTABLE LIMITS FOR URINE DRUG SCREEN Drug  Class       Cutoff (ng/mL) Amphetamine      1000 Barbiturate      200 Benzodiazepine   200 Tricyclics       300 Opiates          300 Cocaine          300 THC              50   CBC WITH DIFFERENTIAL     Status: Abnormal   Collection Time: 10/28/14 12:04 AM  Result Value Ref Range   WBC 6.6 4.0 - 10.5 K/uL   RBC 5.57 4.22 - 5.81 MIL/uL   Hemoglobin 17.6 (H) 13.0 - 17.0 g/dL   HCT 50.1 39.0 - 52.0 %   MCV 89.9 78.0 - 100.0 fL   MCH 31.6 26.0 - 34.0 pg   MCHC 35.1 30.0 - 36.0 g/dL   RDW 12.9 11.5 - 15.5 %   Platelets 359 150 - 400 K/uL   Neutrophils Relative % 32 (L) 43 - 77 %   Neutro Abs 2.1 1.7 - 7.7 K/uL   Lymphocytes Relative 57 (H) 12 - 46 %   Lymphs Abs 3.8 0.7 - 4.0 K/uL   Monocytes Relative 6 3 - 12 %   Monocytes Absolute 0.4 0.1 - 1.0 K/uL   Eosinophils Relative 4 0 - 5 %     Eosinophils Absolute 0.2 0.0 - 0.7 K/uL   Basophils Relative 1 0 - 1 %   Basophils Absolute 0.0 0.0 - 0.1 K/uL  Comprehensive metabolic panel     Status: Abnormal   Collection Time: 10/28/14 12:04 AM  Result Value Ref Range   Sodium 150 (H) 137 - 147 mEq/L   Potassium 4.2 3.7 - 5.3 mEq/L   Chloride 106 96 - 112 mEq/L   CO2 29 19 - 32 mEq/L   Glucose, Bld 111 (H) 70 - 99 mg/dL   BUN 7 6 - 23 mg/dL   Creatinine, Ser 0.91 0.50 - 1.35 mg/dL   Calcium 9.4 8.4 - 10.5 mg/dL   Total Protein 8.5 (H) 6.0 - 8.3 g/dL   Albumin 4.4 3.5 - 5.2 g/dL   AST 21 0 - 37 U/L   ALT 13 0 - 53 U/L   Alkaline Phosphatase 102 39 - 117 U/L   Total Bilirubin 0.3 0.3 - 1.2 mg/dL   GFR calc non Af Amer >90 >90 mL/min   GFR calc Af Amer >90 >90 mL/min    Comment: (NOTE) The eGFR has been calculated using the CKD EPI equation. This calculation has not been validated in all clinical situations. eGFR's persistently <90 mL/min signify possible Chronic Kidney Disease.    Anion gap 15 5 - 15  Ethanol     Status: Abnormal   Collection Time: 10/28/14 12:04 AM  Result Value Ref Range   Alcohol, Ethyl (B) 399 (H) 0 - 11  mg/dL    Comment:        LOWEST DETECTABLE LIMIT FOR SERUM ALCOHOL IS 11 mg/dL FOR MEDICAL PURPOSES ONLY   Acetaminophen level     Status: None   Collection Time: 10/28/14 12:04 AM  Result Value Ref Range   Acetaminophen (Tylenol), Serum <15.0 10 - 30 ug/mL    Comment:        THERAPEUTIC CONCENTRATIONS VARY SIGNIFICANTLY. A RANGE OF 10-30 ug/mL MAY BE AN EFFECTIVE CONCENTRATION FOR MANY PATIENTS. HOWEVER, SOME ARE BEST TREATED AT CONCENTRATIONS OUTSIDE THIS RANGE. ACETAMINOPHEN CONCENTRATIONS >150 ug/mL AT 4 HOURS AFTER INGESTION AND >50 ug/mL AT 12 HOURS AFTER INGESTION ARE OFTEN ASSOCIATED WITH TOXIC REACTIONS.   Salicylate level     Status: Abnormal   Collection Time: 10/28/14 12:04 AM  Result Value Ref Range   Salicylate Lvl <7.5 (L) 2.8 - 20.0 mg/dL   Labs are reviewed and are pertinent for the above.  Current Facility-Administered Medications  Medication Dose Route Frequency Provider Last Rate Last Dose  . acetaminophen (TYLENOL) tablet 650 mg  650 mg Oral Q4H PRN Britt Bottom, NP      . ibuprofen (ADVIL,MOTRIN) tablet 600 mg  600 mg Oral Q8H PRN Britt Bottom, NP      . LORazepam (ATIVAN) injection 0-4 mg  0-4 mg Intravenous 4 times per day Dewaine Oats, PA-C   0 mg at 10/28/14 0250   Followed by  . [START ON 10/30/2014] LORazepam (ATIVAN) injection 0-4 mg  0-4 mg Intravenous Q12H Shari A Upstill, PA-C      . LORazepam (ATIVAN) tablet 1 mg  1 mg Oral Q8H PRN Britt Bottom, NP   1 mg at 10/28/14 0412  . nicotine (NICODERM CQ - dosed in mg/24 hours) patch 21 mg  21 mg Transdermal Once Britt Bottom, NP   21 mg at 10/28/14 0003  . ondansetron (ZOFRAN) tablet 4 mg  4 mg Oral Q8H PRN Britt Bottom, NP      .  zolpidem (AMBIEN) tablet 5 mg  5 mg Oral QHS PRN Britt Bottom, NP       Current Outpatient Prescriptions  Medication Sig Dispense Refill  . lamoTRIgine (LAMICTAL) 100 MG tablet Take 100 mg by mouth daily.    . traZODone (DESYREL)  150 MG tablet Take by mouth at bedtime.      Psychiatric Specialty Exam:     Blood pressure 114/64, pulse 94, temperature 97.5 F (36.4 C), temperature source Oral, resp. rate 16, SpO2 94 %.There is no height or weight on file to calculate BMI.  General Appearance: Disheveled  Eye Contact::  Minimal  Speech:  Normal Rate  Volume:  Normal  Mood:  Anxious  Affect:  Constricted  Thought Process:  Goal Directed  Orientation:  Full (Time, Place, and Person)  Thought Content:  Negative  Suicidal Thoughts:  No  Homicidal Thoughts:  No  Memory:  Immediate;   Fair Recent;   Fair Remote;   Fair  Judgement:  Fair  Insight:  Fair  Psychomotor Activity:  Normal  Concentration:  Fair  Recall:  [employer] of Knowledge:Fair  Language: Good  Akathisia:  No  Handed:  Right  AIMS (if indicated):     Assets:  Communication Skills Desire for Improvement Physical Health  Sleep:   poor   Musculoskeletal: Strength & Muscle Tone: within normal limits Gait & Station: normal Patient leans: N/A  Treatment Plan Summary: Daily contact with patient to assess and evaluate symptoms and progress in treatment Medication management admit patient to [facility] bed 7  Corena Pilgrim, MD 10/28/2014 10:40 AM

## 2014-10-28 NOTE — ED Notes (Signed)
Chuvon Rankin, NP, advised of patient's sodium level of 150. Conf[facility]. Will encourage patient hydration.

## 2014-10-28 NOTE — ED Notes (Signed)
Discharge Note: [facility] and [facility] via [employer]. Patient denies SI/HI/AVH at discharge. Patient reports pain as 0 on scale of 0 to 10. VSS. Patient ambulatory and gait steady. Patient advised of plan of care and verbalized understanding.

## 2014-10-28 NOTE — ED Notes (Signed)
Patient is resting comfortably. 

## 2014-10-28 NOTE — ED Notes (Signed)
Patient admitted to room 40 at 1010 from TCU after having presented to ER stating that he wants to detox from ETOH. He is alert and reports anxiety of 10 on scale of 0 to 10. He is currently resting quietly in pt room. Affect and mood are anxious. Patient is cooperative. Patient reports mild nausea/denies vomiting. Denies AVH. VSS. Patient oriented to unit and offered nourishment.

## 2014-10-28 NOTE — Progress Notes (Signed)
[facility] Observation Crisis Plan  Reason for Crisis Plan:  Substance Abuse   Plan of Care:  Referral for Substance Abuse  Family Support:      Current Living Environment:  Living Arrangements: Non-relatives/Friends  Insurance:   Hospital Account    Name Acct ID Class Status Primary Coverage   Ladell HeadsKruppa, [place]anord [id number] BEHAVIORAL HEALTH OBSERVATION Open None        Guarantor Account (for Hospital Account [id number])    Name Relation to Pt Service Area Active? Acct Type   Ladell HeadsKruppa, Dmitry Self [facility] Yes Behavioral Health   Address Phone       [address] [place] [address] [phone](H)          Coverage Information (for Hospital Account [id number])    Not on file      Legal Guardian:     Primary Care Provider:  No primary care provider on file.  Current Outpatient Providers:[facility] Psychiatrist:   [facility]  Counselor/Therapist:   [facility]  Compliant with Medications:  Yes  Additional Information:   Floria RavelingDax, Tanajah Boulter Mae 11/17/20156:59 PM

## 2014-10-28 NOTE — ED Notes (Signed)
Patient up and ambulatory.  75% of breakfast eaten.  [facility].

## 2014-10-28 NOTE — ED Notes (Addendum)
Pt has 2 bags- 1 black duffle bag and 1 pt belongings bag. Pts bags were placed in locker #40.

## 2014-10-28 NOTE — BH Assessment (Signed)
Tele Assessment Note   Edward Mcclain is an 43 y.o. male.  -Clinician reviewed note by Donetta PottsBeth Tysinger, NP.  Patient is requesting detox from ETOH.  He is drinking about 4 quarts of Wild [employer] International [employer]rish Rose daily.  Patient states that he does want detox from ETOH.  He says that [facility] and was found out to have been drinking.  It is against the rules to be drinking while there so he has been asked to leave until he goes through detox.  Patient says that he had been sober for about two weeks then for the past week he started drinking a gallon per day of Wild [employer] International [employer]rish Rose.  Last drink was around 16:00 on 11/16.  Pt denies any SI, HI or A/V hallucinations.  Patient is talkative.  He admits to being depressed about his relapses.  He reports OCD behavior like checking his zipper frequently, etc  Reports that the OCD and anxiety cause him to drink a lot.  Patient reports having DT symptoms.  When asked if he has detox seizures he responds, "yeah I get to shaking and don't know what is going on around me."  Patient says that he last had that feeling about 5 months ago.  Patient was at [facility] a few months ago for rehab.  [facility].  Pt BAL was 399 at 00:04 on 11/17.  -Patient care was discussed with Donell SievertSpencer Simon, PA who recommends inpatient care.  Patient care was also discussed with [facility] PA Etta GrandchildShari Upsill.  She said that she would put patient on the CIWA protocol and have her BAL looked at aagain in the AM.    Axis I: 303.90 ETOH use d/o severe Axis II: Deferred Axis III:  Past Medical History  Diagnosis Date  . Depression    Axis IV: economic problems, housing problems, occupational problems and problems with access to health care services Axis V: 31-40 impairment in reality testing  Past Medical History:  Past Medical History  Diagnosis Date  . Depression     Past Surgical History   Procedure Laterality Date  . Neck surgery    . Hand surgery      Family History: No family history on file.  Social History:  reports that he has been smoking Cigarettes.  He has been smoking about 0.00 packs per day. He does not have any smokeless tobacco history on file. He reports that he drinks alcohol. He reports that he does not use illicit drugs.  Additional Social History:  Alcohol / Drug Use Pain Medications: N/A Prescriptions: Lamictal, Trazadone Over the Counter: N/A History of alcohol / drug use?: Yes Longest period of sobriety (when/how long): 14 month 20 years ago Negative Consequences of Use: Personal relationships, Legal Withdrawal Symptoms: Patient aware of relationship between substance abuse and physical/medical complications, Nausea / Vomiting, Weakness, Diarrhea, Tremors, Tingling, Fever / Chills, Sweats, DTs, Seizures Onset of Seizures: Detox Date of most recent seizure: 5 months ago Substance #1 Name of Substance 1: ETOH (Wild [place] Rose) 1 - Age of First Use: 43 years of age 28 - Amount (size/oz): 4 quarts of Wild [place] Rose 1 - Frequency: Daily for the last week after a two week sobriety 1 - Duration: Last week 1 - Last Use / Amount: 11/16 around 16:00.  CIWA: CIWA-Ar BP: 117/64 mmHg Pulse Rate: 102 COWS:    PATIENT STRENGTHS: (  choose at least two) Average or above average intelligence Financial means Motivation for treatment/growth Supportive family/friends  Allergies: No Known Allergies  Home Medications:  (Not in a hospital admission)  OB/GYN Status:  No LMP for male patient.  General Assessment Data Location of Assessment: [facility] Is this a Tele or Face-to-Face Assessment?: Tele Assessment Is this an Initial Assessment or a Re-assessment for this encounter?: Initial Assessment Living Arrangements: Non-relatives/Friends ([facility]idential tx house [name] Can pt return to current living arrangement?: Yes (Once he gets sober &  detoxed.) Admission Status: Voluntary Is patient capable of signing voluntary admission?: Yes Transfer from: Acute Hospital Referral Source: Self/Family/Friend     [facility] Crisis Care Plan Living Arrangements: Non-relatives/Friends ([facility]idential tx house [facility] Name of Psychiatrist: Transport planner Name of Therapist: N/A  Education Status Highest grade of school patient has completed: Three years of college  Risk to self with the past 6 months Suicidal Ideation: No Suicidal Intent: No Is patient at risk for suicide?: No Suicidal Plan?: No Access to Means: No What has been your use of drugs/alcohol within the last 12 months?: ETOH daily use Previous Attempts/Gestures: Yes How many times?: 1 Other Self Harm Risks: N/A Triggers for Past Attempts: None known Intentional Self Injurious Behavior: None Family Suicide History: No Recent stressful life event(s): Other (Comment) (No identified stressors.) Persecutory voices/beliefs?: No Depression: Yes Depression Symptoms: Despondent, Insomnia, Guilt, Loss of interest in usual pleasures, Feeling worthless/self pity Substance abuse history and/or treatment for substance abuse?: Yes Suicide prevention information given to non-admitted patients: Not applicable  Risk to Others within the past 6 months Homicidal Ideation: No Thoughts of Harm to Others: No Current Homicidal Intent: No Current Homicidal Plan: No Access to Homicidal Means: No Identified Victim: No one History of harm to others?: No Assessment of Violence: In distant past Violent Behavior Description: Pt cooperative Does patient have access to weapons?: No Criminal Charges Pending?: No Does patient have a court date: No  Psychosis Hallucinations: None noted Delusions: None noted  Mental Status Report Appear/Hygiene: Disheveled, In scrubs Eye Contact: Good Motor Activity: Freedom of movement, Unremarkable Speech: Logical/coherent Level of Consciousness:  Alert Mood: Anxious, Depressed, Helpless, Despair Affect: Anxious, Sad Anxiety Level: Severe Thought Processes: Coherent, Relevant Judgement: Impaired Orientation: Person, Place, Situation, Appropriate for developmental age Obsessive Compulsive Thoughts/Behaviors: Moderate  Cognitive Functioning Concentration: Decreased Memory: Recent Impaired, Remote Impaired IQ: Average Insight: Good Impulse Control: Poor Appetite: Poor Weight Loss:  (Does not eat when drinking) Weight Gain: 0 Sleep: Decreased Total Hours of Sleep:  (<6H/D) Vegetative Symptoms: None  ADLScreening [facility] Assessment Services) Patient's cognitive ability adequate to safely complete daily activities?: Yes Patient able to express need for assistance with ADLs?: Yes Independently performs ADLs?: Yes (appropriate for developmental age)  Prior Inpatient Therapy Prior Inpatient Therapy: Yes Prior Therapy Dates: "Few months ago." Prior Therapy Facilty/Provider(s): [facility] Reason for Treatment: Rehab  Prior Outpatient Therapy Prior Outpatient Therapy: Yes Prior Therapy Dates:  (Last 6 months) Prior Therapy Facilty/Provider(s): [facility] Reason for Treatment: psych meds  ADL Screening (condition at time of admission) Patient's cognitive ability adequate to safely complete daily activities?: Yes Is the patient deaf or have difficulty hearing?: No Does the patient have difficulty seeing, even when wearing glasses/contacts?: No Does the patient have difficulty concentrating, remembering, or making decisions?: No Patient able to express need for assistance with ADLs?: Yes Does the patient have difficulty dressing or bathing?: No Independently performs ADLs?: Yes (appropriate for developmental age) Does the patient have difficulty  walking or climbing stairs?: No Weakness of Legs: None Weakness of Arms/Hands: None       Abuse/Neglect Assessment (Assessment to be complete while patient is alone) Physical Abuse:  Denies Verbal Abuse: Denies Sexual Abuse: Denies Exploitation of patient/patient's resources: Denies Self-Neglect: Denies     Merchant navy officerAdvance Directives (For Healthcare) Does patient have an advance directive?: No Would patient like information on creating an advanced directive?: No - patient declined information    Additional Information 1:1 In Past 12 Months?: No CIRT Risk: No Elopement Risk: No Does patient have medical clearance?: Yes     Disposition:  Disposition Initial Assessment Completed for this Encounter: Yes Disposition of Patient: Inpatient treatment program, Referred to Type of inpatient treatment program: Adult Patient referred to: Other (Comment) (Pt meets inpatient detox criteria.)  Alexandria LodgeHarvey, Verneice Caspers Ray 10/28/2014 1:58 AM

## 2014-10-28 NOTE — Progress Notes (Signed)
[facility] INPATIENT:  Family/Significant Other Suicide Prevention Education  Suicide Prevention Education:  Patient Refusal for Family/Significant Other Suicide Prevention Education: The patient Edward HeadsRobert Mcclain has refused to provide written consent for family/significant other to be provided Family/Significant Other Suicide Prevention Education during admission and/or prior to discharge.    Edward PriceDax, Edward Mcclain Mae 10/28/2014, 6:58 PM

## 2014-10-28 NOTE — Progress Notes (Signed)
[facility] Community Health Specialist [place],    Provided pt with [employer] Orange Card application, highlighting [facility], to help patient with outpatient mental health resources and establish primary care.

## 2014-10-28 NOTE — BH Assessment (Signed)
[facility] Assessment Progress Note   [facility] by Dr. Jannifer FranklinAkintayo to OBS Bed 7 per Berneice Heinrichina Tate, RN, [facility].  [facility] as pt is voluntary.  Updated ED and TTS staff.  Casimer LaniusKristen Madix Blowe, MS, [facility] Licensed Professional Counselor Therapeutic Triage Specialist [facility] Phone: [phone] Fax: [phone]

## 2014-10-29 DIAGNOSIS — F10230 Alcohol dependence with withdrawal, uncomplicated: Secondary | ICD-10-CM | POA: Insufficient documentation

## 2014-10-29 MED ORDER — TRAZODONE HCL 100 MG PO TABS
100.0000 mg | ORAL_TABLET | Freq: Every evening | ORAL | Status: DC | PRN
  Filled 2014-10-29: qty 14

## 2014-10-29 MED ORDER — TRAZODONE HCL 100 MG PO TABS: 100.0000 mg | ORAL_TABLET | Freq: Every day | ORAL | Status: DC

## 2014-10-29 MED ORDER — ADULT MULTIVITAMIN W/MINERALS CH: 1.0000 | ORAL_TABLET | Freq: Every day | ORAL | Status: AC

## 2014-10-29 MED ORDER — LAMOTRIGINE 150 MG PO TABS: 150.0000 mg | ORAL_TABLET | Freq: Every day | ORAL | Status: DC

## 2014-10-29 NOTE — Discharge Instructions (Signed)
To help you maintain as sober lifestyle, a substance abuse treatment program may be beneficial to you.  [facility] have [facility] to [facility].  The Observation Unit staff will arrange for your transportation to the facility:       [facility]      [address]      [place], [place] [address]      [phone]  For your ongoing mental health needs, continue your treatment through [facility]:       [facility]      [address]      [place], [place] [address]      [phone]

## 2014-10-29 NOTE — Discharge Summary (Signed)
[facility] Observation discharge summary  10/29/2014 4:00 PM  Edward Mcclain  MRN:  [id number] Subjective:  Mr Taborda is a 43 year old Caucasian male who presented to the Emergency department seeking detoxification of alcohol.  ETOH on admission was 219.  Patient relates that he had been doing well with his sobriety unitl approximately 1 week ago when he reports that he had a beer after work.  This lead to a drinking binge over the weekend and the loss of his placement at [facility].  Patient has a long history of alcohol use and had been in a 28 day rehabilitation program earlier this year.  Patient states his longest period of sobriety is 4 months.  Ciwa scores running between 5 and 6 with complaints of sweating, chills, and fine motor tremors to both hands.  Currently on Librium Detox protocol to re mediate symptoms of withdrawal.  Patient states that he is "feeling better today"  Denies suicidal ideation, homicidal ideation, auditory or visual hallucinations.  Patient reports that he would like to return to the [facility] if they will take him back.  Patient would like to follow up outpatient to maintain his sobriety and has in the past had an [place] sponsor.  Patient was managed on Librium Detox protocol while under observation at [facility], withdrawal was uncomplicated.  Patient remained stable while under observation and feels comfortable with decision to discharge.  Diagnosis:   DSM5: SSubstance/Addictive Disorders:  Alcohol Related Disorder - Severe (303.90) and Alcohol Withdrawal without Perceptual Disturbances (F10.239) Total Time spent with patient: 45 minutes  Axis I: Substance Abuse Axis II: Deferred Axis III:  Past Medical History  Diagnosis Date  . Depression   . Anxiety   . Bipolar disorder    Axis IV: housing problems, problems related to social environment and problems with primary support group Axis V: 61-70 mild symptoms  ADL's:  Intact  Sleep: Good  Appetite:   Good  Suicidal Ideation:  Denies  Homicidal Ideation:  Denies   Psychiatric Specialty Exam: Physical Exam  Review of Systems  Constitutional: Positive for chills and diaphoresis.  HENT: Negative.   Eyes: Negative.   Respiratory: Negative.   Cardiovascular: Negative.   Gastrointestinal: Negative.   Genitourinary: Negative.   Musculoskeletal: Negative.   Skin: Negative.   Neurological: Positive for tremors (fine hand tremor ).  Endo/Heme/Allergies: Negative.   Psychiatric/Behavioral: Positive for substance abuse. Negative for depression, suicidal ideas, hallucinations and memory loss. The patient is not nervous/anxious and does not have insomnia.     Blood pressure 133/87, pulse 93, temperature 98.1 F (36.7 C), temperature source Oral, resp. rate 18.There is no height or weight on file to calculate BMI.  General Appearance: Casual  Eye Contact::  Good  Speech:  Clear and Coherent  Volume:  Normal  Mood:  Euthymic  Affect:  Congruent  Thought Process:  Coherent, Goal Directed and Logical  Orientation:  Full (Time, Place, and Person)  Thought Content:  WDL  Suicidal Thoughts:  No  Homicidal Thoughts:  No  Memory:  Immediate;   Good Recent;   Good Remote;   Good  Judgement:  Fair makes independent detrimental decisions  Insight:  Fair  Wants to maintain sobriety and agrees needs assistance   Psychomotor Activity:  Normal  Concentration:  Good  Recall:  Good  Fund of Knowledge:Good  Language: NA  Akathisia:  NA  Handed:  Right  AIMS (if indicated):     Assets:  Communication  Skills Desire for Improvement Physical Health Resilience  Sleep:      Musculoskeletal: Strength & Muscle Tone: within normal limits Gait & Station: normal Patient leans: N/A  Current Medications: Current Facility-Administered Medications  Medication Dose Route Frequency Provider Last Rate Last Dose  . acetaminophen (TYLENOL) tablet 650 mg  650 mg Oral Q6H PRN Shuvon Rankin, NP      .  alum & mag hydroxide-simeth (MAALOX/MYLANTA) 200-200-20 MG/5ML suspension 30 mL  30 mL Oral Q4H PRN Shuvon Rankin, NP      . haloperidol (HALDOL) tablet 5 mg  5 mg Oral Q6H PRN Laverle Hobby, PA-C       And  . benztropine (COGENTIN) tablet 1 mg  1 mg Oral Q6H PRN Laverle Hobby, PA-C      . chlordiazePOXIDE (LIBRIUM) capsule 25 mg  25 mg Oral Q6H PRN Mojeed Akintayo      . chlordiazePOXIDE (LIBRIUM) capsule 25 mg  25 mg Oral QID Mojeed Akintayo   25 mg at 10/29/14 1153   Followed by  . [START ON 10/30/2014] chlordiazePOXIDE (LIBRIUM) capsule 25 mg  25 mg Oral TID Mojeed Akintayo       Followed by  . [START ON 10/31/2014] chlordiazePOXIDE (LIBRIUM) capsule 25 mg  25 mg Oral BH-qamhs Mojeed Akintayo       Followed by  . [START ON 11/01/2014] chlordiazePOXIDE (LIBRIUM) capsule 25 mg  25 mg Oral Daily Mojeed Akintayo      . hydrOXYzine (ATARAX/VISTARIL) tablet 25 mg  25 mg Oral Q6H PRN Shuvon Rankin, NP   25 mg at 10/28/14 2003  . lamoTRIgine (LAMICTAL) tablet 150 mg  150 mg Oral Daily Shuvon Rankin, NP   150 mg at 10/29/14 0853  . loperamide (IMODIUM) capsule 2-4 mg  2-4 mg Oral PRN Shuvon Rankin, NP      . magnesium hydroxide (MILK OF MAGNESIA) suspension 30 mL  30 mL Oral Daily PRN Shuvon Rankin, NP      . multivitamin with minerals tablet 1 tablet  1 tablet Oral Daily Shuvon Rankin, NP   1 tablet at 10/29/14 0852  . nicotine (NICODERM CQ - dosed in mg/24 hours) patch 21 mg  21 mg Transdermal Daily Shuvon Rankin, NP   21 mg at 10/28/14 1658  . ondansetron (ZOFRAN-ODT) disintegrating tablet 4 mg  4 mg Oral Q6H PRN Shuvon Rankin, NP   4 mg at 10/28/14 1928  . thiamine (VITAMIN B-1) tablet 100 mg  100 mg Oral Daily Mojeed Akintayo   100 mg at 10/29/14 0852  . traZODone (DESYREL) tablet 50 mg  50 mg Oral QHS,MR X 1 Laverle Hobby, PA-C   50 mg at 10/28/14 2110    Lab Results:  Results for orders placed or performed during the hospital encounter of 10/27/14 (from the past 48 hour(s))  Drug  screen panel, emergency     Status: None   Collection Time: 10/28/14 12:02 AM  Result Value Ref Range   Opiates NONE DETECTED NONE DETECTED   Cocaine NONE DETECTED NONE DETECTED   Benzodiazepines NONE DETECTED NONE DETECTED   Amphetamines NONE DETECTED NONE DETECTED   Tetrahydrocannabinol NONE DETECTED NONE DETECTED   Barbiturates NONE DETECTED NONE DETECTED    Comment:        DRUG SCREEN FOR MEDICAL PURPOSES ONLY.  IF CONFIRMATION IS NEEDED FOR ANY PURPOSE, NOTIFY LAB WITHIN 5 DAYS.        LOWEST DETECTABLE LIMITS FOR URINE DRUG SCREEN Drug Class  Cutoff (ng/mL) Amphetamine      1000 Barbiturate      200 Benzodiazepine   299 Tricyclics       371 Opiates          300 Cocaine          300 THC              50   CBC WITH DIFFERENTIAL     Status: Abnormal   Collection Time: 10/28/14 12:04 AM  Result Value Ref Range   WBC 6.6 4.0 - 10.5 K/uL   RBC 5.57 4.22 - 5.81 MIL/uL   Hemoglobin 17.6 (H) 13.0 - 17.0 g/dL   HCT 50.1 39.0 - 52.0 %   MCV 89.9 78.0 - 100.0 fL   MCH 31.6 26.0 - 34.0 pg   MCHC 35.1 30.0 - 36.0 g/dL   RDW 12.9 11.5 - 15.5 %   Platelets 359 150 - 400 K/uL   Neutrophils Relative % 32 (L) 43 - 77 %   Neutro Abs 2.1 1.7 - 7.7 K/uL   Lymphocytes Relative 57 (H) 12 - 46 %   Lymphs Abs 3.8 0.7 - 4.0 K/uL   Monocytes Relative 6 3 - 12 %   Monocytes Absolute 0.4 0.1 - 1.0 K/uL   Eosinophils Relative 4 0 - 5 %   Eosinophils Absolute 0.2 0.0 - 0.7 K/uL   Basophils Relative 1 0 - 1 %   Basophils Absolute 0.0 0.0 - 0.1 K/uL  Comprehensive metabolic panel     Status: Abnormal   Collection Time: 10/28/14 12:04 AM  Result Value Ref Range   Sodium 150 (H) 137 - 147 mEq/L   Potassium 4.2 3.7 - 5.3 mEq/L   Chloride 106 96 - 112 mEq/L   CO2 29 19 - 32 mEq/L   Glucose, Bld 111 (H) 70 - 99 mg/dL   BUN 7 6 - 23 mg/dL   Creatinine, Ser 0.91 0.50 - 1.35 mg/dL   Calcium 9.4 8.4 - 10.5 mg/dL   Total Protein 8.5 (H) 6.0 - 8.3 g/dL   Albumin 4.4 3.5 - 5.2 g/dL   AST 21  0 - 37 U/L   ALT 13 0 - 53 U/L   Alkaline Phosphatase 102 39 - 117 U/L   Total Bilirubin 0.3 0.3 - 1.2 mg/dL   GFR calc non Af Amer >90 >90 mL/min   GFR calc Af Amer >90 >90 mL/min    Comment: (NOTE) The eGFR has been calculated using the CKD EPI equation. This calculation has not been validated in all clinical situations. eGFR's persistently <90 mL/min signify possible Chronic Kidney Disease.    Anion gap 15 5 - 15  Ethanol     Status: Abnormal   Collection Time: 10/28/14 12:04 AM  Result Value Ref Range   Alcohol, Ethyl (B) 399 (H) 0 - 11 mg/dL    Comment:        LOWEST DETECTABLE LIMIT FOR SERUM ALCOHOL IS 11 mg/dL FOR MEDICAL PURPOSES ONLY   Acetaminophen level     Status: None   Collection Time: 10/28/14 12:04 AM  Result Value Ref Range   Acetaminophen (Tylenol), Serum <15.0 10 - 30 ug/mL    Comment:        THERAPEUTIC CONCENTRATIONS VARY SIGNIFICANTLY. A RANGE OF 10-30 ug/mL MAY BE AN EFFECTIVE CONCENTRATION FOR MANY PATIENTS. HOWEVER, SOME ARE BEST TREATED AT CONCENTRATIONS OUTSIDE THIS RANGE. ACETAMINOPHEN CONCENTRATIONS >150 ug/mL AT 4 HOURS AFTER INGESTION AND >50 ug/mL AT 12 HOURS AFTER  INGESTION ARE OFTEN ASSOCIATED WITH TOXIC REACTIONS.   Salicylate level     Status: Abnormal   Collection Time: 10/28/14 12:04 AM  Result Value Ref Range   Salicylate Lvl <2.5 (L) 2.8 - 20.0 mg/dL  Ethanol     Status: Abnormal   Collection Time: 10/28/14 11:33 AM  Result Value Ref Range   Alcohol, Ethyl (B) 219 (H) 0 - 11 mg/dL    Comment:        LOWEST DETECTABLE LIMIT FOR SERUM ALCOHOL IS 11 mg/dL FOR MEDICAL PURPOSES ONLY     Physical Findings: AIMS: Facial and Oral Movements Muscles of Facial Expression: None, normal Lips and Perioral Area: None, normal Jaw: None, normal Tongue: None, normal,Extremity Movements Upper (arms, wrists, hands, fingers): None, normal Lower (legs, knees, ankles, toes): None, normal, Trunk Movements Neck, shoulders, hips: None,  normal, Overall Severity Severity of abnormal movements (highest score from questions above): None, normal Incapacitation due to abnormal movements: None, normal Patient's awareness of abnormal movements (rate only patient's report): No Awareness, Dental Status Current problems with teeth and/or dentures?: No Does patient usually wear dentures?: No  CIWA:  CIWA-Ar Total: 4 COWS:     Treatment Plan Summary: Daily contact with patient to assess and evaluate symptoms and progress in treatment Medication management discuss return to halfway house to continue recovery, consider 12 step program to augment substance abuse recovery program   Plan:  Arrange substance abuse recovery services through [facility] as assisted by [facility] TTS staff.  Patient to be discharged home with outpatient followup.  14 day supply of lamictal 150 mg daily, Trazodone 131m at bedtime, and Multivitamin with minerals one tab daily provided to patient at discharge.  Patient encouraged to contact his sponsor and to continue to follow AA 12 step program.   Medical Decision Making Problem Points:  Established problem, stable/improving (1) Data Points:  Review and summation of old records (2) Review of medication regiment & side effects (2)   EKennedy BuckerPMH-NP  10/29/2014, 4:00 PM

## 2014-10-29 NOTE — Progress Notes (Signed)
Patient ID: Edward HeadsRobert Spallone, male   DOB: Feb 16, 1971, 43 y.o.   MRN: [id number] D --  Pt. Denies pain at this time.  He remains in bed either asleep or watching TV.  Pt. Is calm, cooperative and polite to staff.  His anxiety appears to be trending downward ( see CIWA ).   Pt. Is alert and talks with staff about his issues with ETOH for all his life.  He reports that his social anxiety prevents him from participating in AA meetings and telling strangers about his issues.   Pt. 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   He states being happy with his job  , but it requires him to travel and interferrs with going to [employer] meetings.   A  --  Support and meds as ordered.  R  ---  Pt. Remains calm on unit

## 2014-10-29 NOTE — Progress Notes (Signed)
Patient ID: Edward HeadsRobert Mcclain, male   DOB: November 03, 1971, 43 y.o.   MRN: [id number] DIS-CHARGE   Note  --    Discharge pt as ordered.  All possessions returned.  Medications were provided into care of pt.  For transport to [facility].  Pt was calm and cooperative  On DC.  Pt. Denied pain or acute distress or withdrawn issues And agreed to stay safe after being DC'd.   [facility] transport picked up pt.  To take him to [facility].  --- A ---  escocrt pt. To front lobby at 1625 hrs., 10/29/14  --  R --  Pt. Was safe and pleasant at time of DC

## 2014-10-29 NOTE — Plan of Care (Signed)
[facility] Observation Crisis Plan  Reason for Crisis Plan:  Substance Abuse   Plan of Care:  Referral for Substance Abuse  Family Support:    Parents   Current Living Environment:  Living Arrangements: Non-relatives/Friends; [facility]; pt can return after substance abuse treatment.  Insurance:  Self Pay Hospital Account    Name Acct ID Class Status Primary Coverage   Ladell HeadsKruppa, Satoshi [id number] BEHAVIORAL HEALTH OBSERVATION Open None        Guarantor Account (for Hospital Account [id number])    Name Relation to Pt Service Area Active? Acct Type   Ladell HeadsKruppa, Jonty Self [facility] Yes Behavioral Health   Address Phone       [address] [place], [place] [address] [phone](H)          Coverage Information (for Hospital Account [id number])    Not on file      Legal Guardian:   Self  Primary Care Provider:  No primary care provider on file.  Current [facility] Prov[facility]:  [facility]  Psychiatrist:   [facility]  Counselor/Therapist:   None  Compliant with Medications:  Yes  Additional Information: After consulting with Claudette Headonrad Withrow, NP it has been determined that pt does not present a life threatening danger to himself or others, and that psychiatric hospitalization is not indicated for him at this time.  He would, however, benefit from substance abuse treatment.  The halfway house where he currently lives is willing to take him back, but only after he completes residential treatment.  Renata CapriceConrad is agreeable to pt being placed in a residential facility if a bed can be found in a timely fashion.  Pt signed Consent to Release Information to [facility], to [facility], and to the [facility].  At 14:50 I received a call from [place] [facility] notifying me that pt has been accepted there.  She requests that pt be sent with a supply of his home medications.  She also asks that nursing staff call [phone] as [facility].  Pt will be  transported via [facility].  Please call them to arrange for transportation at [phone].Kizzie Bane.  Zale Marcotte, Harriette Bouillonhomas Patrick 11/18/20153:11 PM

## 2014-10-29 NOTE — H&P (Signed)
[facility] Observation H & P 10/29/2014 12:38 PM Edward Mcclain  MRN:  [id number] Subjective:  Edward Mcclain is a 43 year old Caucasian male who presented to the Emergency department seeking detoxification of alcohol.  ETOH on admission was 219.  Patient relates that he had been doing well with his sobriety unitl approximately 1 week ago when he reports that he had a beer after work.  This lead to a drinking binge over the weekend and the loss of his placement at [facility].  Patient has a long history of alcohol use and had been in a 28 day rehabilitation program earlier this year.  Patient states his longest period of sobriety is 4 months.  Ciwa scores running between 5 and 6 with complaints of sweating, chills, and fine motor tremors to both hands.  Currently on Librium Detox protocol to re mediate symptoms of withdrawal.  Patient states that he is "feeling better today"  Denies suicidal ideation, homicidal ideation, auditory or visual hallucinations.  Patient reports that he would like to return to the [facility] if they will take him back.  Patient would like to follow up outpatient to maintain his sobriety and has in the past had an [place] sponsor.   Diagnosis:   DSM5: SSubstance/Addictive Disorders:  Alcohol Related Disorder - Severe (303.90) and Alcohol Withdrawal without Perceptual Disturbances (F10.239) Total Time spent with patient: 45 minutes  Axis I: Substance Abuse Axis II: Deferred Axis III:  Past Medical History  Diagnosis Date  . Depression   . Anxiety   . Bipolar disorder    Axis IV: housing problems, problems related to social environment and problems with primary support group Axis V: 61-70 mild symptoms  ADL's:  Intact  Sleep: Good  Appetite:  Good  Suicidal Ideation:  Denies  Homicidal Ideation:  Denies   Psychiatric Specialty Exam: Physical Exam  Review of Systems  Constitutional: Positive for chills and diaphoresis.  HENT: Negative.   Eyes: Negative.    Respiratory: Negative.   Cardiovascular: Negative.   Gastrointestinal: Negative.   Genitourinary: Negative.   Musculoskeletal: Negative.   Skin: Negative.   Neurological: Positive for tremors (fine hand tremor ).  Endo/Heme/Allergies: Negative.   Psychiatric/Behavioral: Positive for substance abuse. Negative for depression, suicidal ideas, hallucinations and memory loss. The patient is not nervous/anxious and does not have insomnia.     Blood pressure 133/87, pulse 93, temperature 98.1 F (36.7 C), temperature source Oral, resp. rate 18.There is no height or weight on file to calculate BMI.  General Appearance: Casual  Eye Contact::  Good  Speech:  Clear and Coherent  Volume:  Normal  Mood:  Euthymic  Affect:  Congruent  Thought Process:  Coherent, Goal Directed and Logical  Orientation:  Full (Time, Place, and Person)  Thought Content:  WDL  Suicidal Thoughts:  No  Homicidal Thoughts:  No  Memory:  Immediate;   Good Recent;   Good Remote;   Good  Judgement:  Fair makes independent detrimental decisions  Insight:  Fair  Wants to maintain sobriety and agrees needs assistance   Psychomotor Activity:  Normal  Concentration:  Good  Recall:  Good  Fund of Knowledge:Good  Language: NA  Akathisia:  NA  Handed:  Right  AIMS (if indicated):     Assets:  Communication Skills Desire for Improvement Physical Health Resilience  Sleep:      Musculoskeletal: Strength & Muscle Tone: within normal limits Gait & Station: normal Patient leans: N/A  Current  Medications: Current Facility-Administered Medications  Medication Dose Route Frequency Provider Last Rate Last Dose  . acetaminophen (TYLENOL) tablet 650 mg  650 mg Oral Q6H PRN Shuvon Rankin, NP      . alum & mag hydroxide-simeth (MAALOX/MYLANTA) 200-200-20 MG/5ML suspension 30 mL  30 mL Oral Q4H PRN Shuvon Rankin, NP      . haloperidol (HALDOL) tablet 5 mg  5 mg Oral Q6H PRN Laverle Hobby, PA-C       And  . benztropine  (COGENTIN) tablet 1 mg  1 mg Oral Q6H PRN Laverle Hobby, PA-C      . chlordiazePOXIDE (LIBRIUM) capsule 25 mg  25 mg Oral Q6H PRN Mojeed Akintayo      . chlordiazePOXIDE (LIBRIUM) capsule 25 mg  25 mg Oral QID Mojeed Akintayo   25 mg at 10/29/14 1153   Followed by  . [START ON 10/30/2014] chlordiazePOXIDE (LIBRIUM) capsule 25 mg  25 mg Oral TID Mojeed Akintayo       Followed by  . [START ON 10/31/2014] chlordiazePOXIDE (LIBRIUM) capsule 25 mg  25 mg Oral BH-qamhs Mojeed Akintayo       Followed by  . [START ON 11/01/2014] chlordiazePOXIDE (LIBRIUM) capsule 25 mg  25 mg Oral Daily Mojeed Akintayo      . hydrOXYzine (ATARAX/VISTARIL) tablet 25 mg  25 mg Oral Q6H PRN Shuvon Rankin, NP   25 mg at 10/28/14 2003  . lamoTRIgine (LAMICTAL) tablet 150 mg  150 mg Oral Daily Shuvon Rankin, NP   150 mg at 10/29/14 0853  . loperamide (IMODIUM) capsule 2-4 mg  2-4 mg Oral PRN Shuvon Rankin, NP      . magnesium hydroxide (MILK OF MAGNESIA) suspension 30 mL  30 mL Oral Daily PRN Shuvon Rankin, NP      . multivitamin with minerals tablet 1 tablet  1 tablet Oral Daily Shuvon Rankin, NP   1 tablet at 10/29/14 0852  . nicotine (NICODERM CQ - dosed in mg/24 hours) patch 21 mg  21 mg Transdermal Daily Shuvon Rankin, NP   21 mg at 10/28/14 1658  . ondansetron (ZOFRAN-ODT) disintegrating tablet 4 mg  4 mg Oral Q6H PRN Shuvon Rankin, NP   4 mg at 10/28/14 1928  . thiamine (VITAMIN B-1) tablet 100 mg  100 mg Oral Daily Mojeed Akintayo   100 mg at 10/29/14 0852  . traZODone (DESYREL) tablet 50 mg  50 mg Oral QHS,Edward X 1 Laverle Hobby, PA-C   50 mg at 10/28/14 2110    Lab Results:  Results for orders placed or performed during the hospital encounter of 10/27/14 (from the past 48 hour(s))  Drug screen panel, emergency     Status: None   Collection Time: 10/28/14 12:02 AM  Result Value Ref Range   Opiates NONE DETECTED NONE DETECTED   Cocaine NONE DETECTED NONE DETECTED   Benzodiazepines NONE DETECTED NONE DETECTED    Amphetamines NONE DETECTED NONE DETECTED   Tetrahydrocannabinol NONE DETECTED NONE DETECTED   Barbiturates NONE DETECTED NONE DETECTED    Comment:        DRUG SCREEN FOR MEDICAL PURPOSES ONLY.  IF CONFIRMATION IS NEEDED FOR ANY PURPOSE, NOTIFY LAB WITHIN 5 DAYS.        LOWEST DETECTABLE LIMITS FOR URINE DRUG SCREEN Drug Class       Cutoff (ng/mL) Amphetamine      1000 Barbiturate      200 Benzodiazepine   209 Tricyclics       470 Opiates  300 Cocaine          300 THC              50   CBC WITH DIFFERENTIAL     Status: Abnormal   Collection Time: 10/28/14 12:04 AM  Result Value Ref Range   WBC 6.6 4.0 - 10.5 K/uL   RBC 5.57 4.22 - 5.81 MIL/uL   Hemoglobin 17.6 (H) 13.0 - 17.0 g/dL   HCT 50.1 39.0 - 52.0 %   MCV 89.9 78.0 - 100.0 fL   MCH 31.6 26.0 - 34.0 pg   MCHC 35.1 30.0 - 36.0 g/dL   RDW 12.9 11.5 - 15.5 %   Platelets 359 150 - 400 K/uL   Neutrophils Relative % 32 (L) 43 - 77 %   Neutro Abs 2.1 1.7 - 7.7 K/uL   Lymphocytes Relative 57 (H) 12 - 46 %   Lymphs Abs 3.8 0.7 - 4.0 K/uL   Monocytes Relative 6 3 - 12 %   Monocytes Absolute 0.4 0.1 - 1.0 K/uL   Eosinophils Relative 4 0 - 5 %   Eosinophils Absolute 0.2 0.0 - 0.7 K/uL   Basophils Relative 1 0 - 1 %   Basophils Absolute 0.0 0.0 - 0.1 K/uL  Comprehensive metabolic panel     Status: Abnormal   Collection Time: 10/28/14 12:04 AM  Result Value Ref Range   Sodium 150 (H) 137 - 147 mEq/L   Potassium 4.2 3.7 - 5.3 mEq/L   Chloride 106 96 - 112 mEq/L   CO2 29 19 - 32 mEq/L   Glucose, Bld 111 (H) 70 - 99 mg/dL   BUN 7 6 - 23 mg/dL   Creatinine, Ser 0.91 0.50 - 1.35 mg/dL   Calcium 9.4 8.4 - 10.5 mg/dL   Total Protein 8.5 (H) 6.0 - 8.3 g/dL   Albumin 4.4 3.5 - 5.2 g/dL   AST 21 0 - 37 U/L   ALT 13 0 - 53 U/L   Alkaline Phosphatase 102 39 - 117 U/L   Total Bilirubin 0.3 0.3 - 1.2 mg/dL   GFR calc non Af Amer >90 >90 mL/min   GFR calc Af Amer >90 >90 mL/min    Comment: (NOTE) The eGFR has been  calculated using the CKD EPI equation. This calculation has not been validated in all clinical situations. eGFR's persistently <90 mL/min signify possible Chronic Kidney Disease.    Anion gap 15 5 - 15  Ethanol     Status: Abnormal   Collection Time: 10/28/14 12:04 AM  Result Value Ref Range   Alcohol, Ethyl (B) 399 (H) 0 - 11 mg/dL    Comment:        LOWEST DETECTABLE LIMIT FOR SERUM ALCOHOL IS 11 mg/dL FOR MEDICAL PURPOSES ONLY   Acetaminophen level     Status: None   Collection Time: 10/28/14 12:04 AM  Result Value Ref Range   Acetaminophen (Tylenol), Serum <15.0 10 - 30 ug/mL    Comment:        THERAPEUTIC CONCENTRATIONS VARY SIGNIFICANTLY. A RANGE OF 10-30 ug/mL MAY BE AN EFFECTIVE CONCENTRATION FOR MANY PATIENTS. HOWEVER, SOME ARE BEST TREATED AT CONCENTRATIONS OUTSIDE THIS RANGE. ACETAMINOPHEN CONCENTRATIONS >150 ug/mL AT 4 HOURS AFTER INGESTION AND >50 ug/mL AT 12 HOURS AFTER INGESTION ARE OFTEN ASSOCIATED WITH TOXIC REACTIONS.   Salicylate level     Status: Abnormal   Collection Time: 10/28/14 12:04 AM  Result Value Ref Range   Salicylate Lvl <4.6 (L) 2.8 - 20.0  mg/dL  Ethanol     Status: Abnormal   Collection Time: 10/28/14 11:33 AM  Result Value Ref Range   Alcohol, Ethyl (B) 219 (H) 0 - 11 mg/dL    Comment:        LOWEST DETECTABLE LIMIT FOR SERUM ALCOHOL IS 11 mg/dL FOR MEDICAL PURPOSES ONLY     Physical Findings: AIMS: Facial and Oral Movements Muscles of Facial Expression: None, normal Lips and Perioral Area: None, normal Jaw: None, normal Tongue: None, normal,Extremity Movements Upper (arms, wrists, hands, fingers): None, normal Lower (legs, knees, ankles, toes): None, normal, Trunk Movements Neck, shoulders, hips: None, normal, Overall Severity Severity of abnormal movements (highest score from questions above): None, normal Incapacitation due to abnormal movements: None, normal Patient's awareness of abnormal movements (rate only patient's  report): No Awareness, Dental Status Current problems with teeth and/or dentures?: No Does patient usually wear dentures?: No  CIWA:  CIWA-Ar Total: 4 COWS:     Treatment Plan Summary: Daily contact with patient to assess and evaluate symptoms and progress in treatment Medication management discuss return to halfway house to continue recovery, consider 12 step program to augment substance abuse recovery program   Plan:  Arrange substance abuse recovery services through [facility] as assisted by [facility] staff.  Patient to be discharged home with outpatient followup.  14 day supply of lamictal 150 mg daily, Trazodone 190m at bedtime, and Multivitamin with minerals one tab daily provided to patient at discharge.  Patient encouraged to contact his sponsor and to continue to follow AA 12 step program.   Medical Decision Making Problem Points:  Established problem, stable/improving (1) Data Points:  Review and summation of old records (2) Review of medication regiment & side effects (2)   EKennedy BuckerPMH-NP  10/29/2014, 12:38 PM

## 2017-05-06 DIAGNOSIS — Z79899 Other long term (current) drug therapy: Secondary | ICD-10-CM | POA: Insufficient documentation

## 2017-05-06 DIAGNOSIS — R55 Syncope and collapse: Secondary | ICD-10-CM | POA: Insufficient documentation

## 2017-05-06 DIAGNOSIS — M542 Cervicalgia: Secondary | ICD-10-CM | POA: Diagnosis present

## 2017-05-06 DIAGNOSIS — F17210 Nicotine dependence, cigarettes, uncomplicated: Secondary | ICD-10-CM | POA: Diagnosis not present

## 2017-05-07 ENCOUNTER — Encounter (HOSPITAL_COMMUNITY): Payer: Self-pay

## 2017-05-07 ENCOUNTER — Emergency Department (HOSPITAL_COMMUNITY): Payer: BLUE CROSS/BLUE SHIELD

## 2017-05-07 ENCOUNTER — Emergency Department (HOSPITAL_COMMUNITY)
Admission: EM | Admit: 2017-05-07 | Discharge: 2017-05-07 | Disposition: A | Discharge status: Home / Self Care | Payer: BLUE CROSS/BLUE SHIELD | Attending: Emergency Medicine | Admitting: Emergency Medicine

## 2017-05-07 DIAGNOSIS — M542 Cervicalgia: Secondary | ICD-10-CM

## 2017-05-07 DIAGNOSIS — M5030 Other cervical disc degeneration, unspecified cervical region: Secondary | ICD-10-CM

## 2017-05-07 MED ORDER — KETOROLAC TROMETHAMINE 30 MG/ML IJ SOLN
30.0000 mg | Freq: Once | INTRAMUSCULAR | Status: AC
  Administered 2017-05-07: 30 mg via INTRAVENOUS
  Filled 2017-05-07: qty 1

## 2017-05-07 MED ORDER — OXYCODONE-ACETAMINOPHEN 5-325 MG PO TABS
1.0000 | ORAL_TABLET | Freq: Three times a day (TID) | ORAL | 0 refills | Status: AC | PRN
Start: 2017-05-07 — End: ?

## 2017-05-07 NOTE — ED Triage Notes (Signed)
Pt c/o neck pain after having a syncopal episode 3 weeks ago. Hx of prior neck fx. Pt has been resting neck for past 3 weeks. Tonight, turned head and hear a "snap, a crunching noise, and then my ears started popping".

## 2017-05-07 NOTE — Discharge Instructions (Signed)
Please call Dr. Val RilesNundkumar's office when they reopen to schedule a follow up appointment. Please make the appointment for 1-2 weeks from now. Please wear the [place] collar from now until you are re-assessed by neurosurgery. Please use caution when using pain medication because it can make you sleepy. You can also take 800 mg of ibuprofen every 8 hours as needed for pain and inflammation control.

## 2017-05-07 NOTE — ED Provider Notes (Signed)
[facility]-EMERGENCY DEPT Provider Note   CSN: [id number] Arrival date & time: 05/06/17  2226     History   Chief Complaint Chief Complaint  Patient presents with  . Neck Injury    HPI Edward Mcclain is a 46 y.o. male with history of cervical fracture in 1998 who presents with neck pain after falling 3 weeks ago. Patient states he had a syncopal episode after he was on a very low calorie diet and hit his face. Patient has had popping and cracking with associated neck pain for the past 3 weeks. He has taken Tylenol and ibuprofen without relief. He has had associated sinus congestion and popping in his ears. Patient denies any numbness or tingling, bowel or bladder incontinence, pain elsewhere. He denies any chest pain, shortness of breath, abdominal pain, nausea, vomiting.  HPI  Past Medical History:  Diagnosis Date  . Anxiety   . Bipolar disorder (HCC)   . Depression     Patient Active Problem List   Diagnosis Date Noted  . Alcohol dependence with uncomplicated withdrawal (HCC)   . Alcohol use with uncomplicated intoxication (HCC) 10/28/2014  . Alcohol dependence (HCC) 10/28/2014  . S/P alcohol detoxification 08/13/2014    Past Surgical History:  Procedure Laterality Date  . HAND SURGERY    . NECK SURGERY         Home Medications    Prior to Admission medications   Medication Sig Start Date End Date Taking? Authorizing Provider  gabapentin (NEURONTIN) 300 MG capsule Take 300 mg by mouth 3 (three) times daily.   Yes [provider]  lamoTRIgine (LAMICTAL) 200 MG tablet Take 200 mg by mouth 2 (two) times daily.   Yes [provider]  traZODone (DESYREL) 100 MG tablet Take 1 tablet (100 mg total) by mouth at bedtime. For insomnia 10/29/14  Yes Maurice March, NP  lamoTRIgine (LAMICTAL) 150 MG tablet Take 1 tablet (150 mg total) by mouth daily. Patient not taking: Reported on 05/07/2017 10/29/14   Maurice March, NP  Multiple Vitamin (MULTIVITAMIN WITH  MINERALS) TABS tablet Take 1 tablet by mouth daily. Patient not taking: Reported on 05/07/2017 10/29/14   Maurice March, NP    Family History No family history on file.  Social History Social History  Substance Use Topics  . Smoking status: Current Every Day Smoker    Packs/day: 1.00    Years: 27.00    Types: Cigarettes  . Smokeless tobacco: Not on file  . Alcohol use Yes     Comment: Daily Use 12 to 18 beers per day or 2 bottles of [place]     Allergies   Patient has no known allergies.   Review of Systems Review of Systems  Constitutional: Negative for chills and fever.  HENT: Negative for facial swelling and sore throat.   Respiratory: Negative for shortness of breath.   Cardiovascular: Negative for chest pain.  Gastrointestinal: Negative for abdominal pain, nausea and vomiting.  Genitourinary: Negative for dysuria.  Musculoskeletal: Positive for back pain (thoracic) and neck pain.  Skin: Negative for rash and wound.  Neurological: Negative for numbness and headaches.  Psychiatric/Behavioral: The patient is not nervous/anxious.      Physical Exam Updated Vital Signs BP 134/76   Pulse 78   Temp 98.7 F (37.1 C) (Oral)   Resp 18   Wt 83.9 kg (185 lb)   SpO2 100%   Physical Exam  Constitutional: He appears well-developed and well-nourished. No distress.  HENT:  Head: Normocephalic and atraumatic.  Mouth/Throat: Oropharynx is clear and moist. No oropharyngeal exudate.  Eyes: Conjunctivae are normal. Pupils are equal, round, and reactive to light. Right eye exhibits no discharge. Left eye exhibits no discharge. No scleral icterus.  Neck: Normal range of motion. Neck supple. Spinous process tenderness and muscular tenderness present. No thyromegaly present.  Cardiovascular: Normal rate, regular rhythm, normal heart sounds and intact distal pulses.  Exam reveals no gallop and no friction rub.   No murmur heard. Pulmonary/Chest: Effort normal and breath  sounds normal. No stridor. No respiratory distress. He has no wheezes. He has no rales.  Abdominal: Soft. Bowel sounds are normal. He exhibits no distension. There is no tenderness. There is no rebound and no guarding.  Musculoskeletal: He exhibits no edema.       Cervical back: He exhibits tenderness and bony tenderness.       Thoracic back: He exhibits tenderness and bony tenderness.       Back:  Lymphadenopathy:    He has no cervical adenopathy.  Neurological: He is alert. Coordination normal.  CN 3-12 intact; normal sensation throughout; 5/5 strength in all 4 extremities; equal bilateral grip strength  Skin: Skin is warm and dry. No rash noted. He is not diaphoretic. No pallor.  Psychiatric: He has a normal mood and affect.  Nursing note and vitals reviewed.    ED Treatments / Results  Labs (all labs ordered are listed, but only abnormal results are displayed) Labs Reviewed - No data to display  EKG  EKG Interpretation None       Radiology Dg Thoracic Spine 2 View  Result Date: 05/07/2017 CLINICAL DATA:  Patient fell 3 weeks ago. Pain and cracking in the neck. Previous history of cervical fracture in 1998. EXAM: THORACIC SPINE 2 VIEWS COMPARISON:  None. FINDINGS: Normal alignment of the thoracic spine. Diffuse degenerative changes with narrowed interspaces and associated endplate hypertrophic change. No vertebral compression deformities. No paraspinal soft tissue swelling. Incidental note of moderate anterior subluxation of C4 on C5. See additional report of CT cervical spine done today. IMPRESSION: Normal alignment of the thoracic spine. No acute displaced fractures identified. Diffuse degenerative changes. Incidental note of anterior subluxation of C4 on C5. See additional report of CT cervical spine. Electronically Signed   By: Burman Nieves M.D.   On: 05/07/2017 05:49   Ct Cervical Spine Wo Contrast  Result Date: 05/07/2017 CLINICAL DATA:  Neck pain after syncopal  episode 3 weeks ago. Felt a snap and crunching noise in neck tonight. EXAM: CT CERVICAL SPINE WITHOUT CONTRAST TECHNIQUE: Multidetector CT imaging of the cervical spine was performed without intravenous contrast. Multiplanar CT image reconstructions were also generated. COMPARISON:  None. FINDINGS: ALIGNMENT: Reversed lordosis at C4-5.  Grade 1 C4-5 anterolisthesis. SKULL BASE AND VERTEBRAE: Posteriorly subluxed C5 superior articular facets with respect to the C4 inferior articular facet. Widened C4-5 interspinous space. Cervical vertebral bodies and posterior elements are intact. Moderate to severe C5-6 and C6-7 disc height loss with endplate sclerosis and marginal spurring. Moderate C4-5 disc height loss. C2-3 bridging osteophytes. No destructive bony lesions. C1-2 articulation maintained. SOFT TISSUES AND SPINAL CANAL: Nonsuspicious. DISC LEVELS: Mild canal stenosis C5-6 and C6-7. Severe RIGHT C5-6 neural foraminal narrowing. UPPER CHEST: Lung apices are clear. OTHER: None. IMPRESSION: No acute fracture. Grade 1 C4-5 anterolisthesis concerning for hyperflexion injury given constellation of findings, with mildly perched facets. Recommend stabilization and MRI of the cervical spine for further characterization. Mild canal stenosis C5-6 and C6-7.  Severe RIGHT C5-6 neural foraminal narrowing. Acute findings discussed with and reconfirmed by PA.Marv Alfrey on 05/07/2017 at 5:45 am. Electronically Signed   By: Awilda Metroourtnay  Bloomer M.D.   On: 05/07/2017 05:47    Procedures Procedures (including critical care time)  Medications Ordered in ED Medications  ketorolac (TORADOL) 30 MG/ML injection 30 mg (30 mg Intravenous Given 05/07/17 [id number])     Initial Impression / Assessment and Plan / ED Course  I have reviewed the triage vital signs and the nursing notes.  Pertinent labs & imaging results that were available during my care of the patient were reviewed by me and considered in my medical decision making (see  chart for details).     CT of the C-spine shows no fracture, however grade 1 C4-C5 anterolisthesis concerning for hyperflexion injury given constellation of findings, with mildly perched facets. Also mild canal stenosis at C5-C6 and C6/7 and severe right C5-6 neural foraminal narrowing. MRI is recommended by radiologist. I consulted neurologist, Dr. Newell CoralNudelman who recommends MRI as well. At shift change, [facility] for continued evaluation, follow up of MRI, consult neurosurgery and determination of disposition.     Final Clinical Impressions(s) / ED Diagnoses   Final diagnoses:  Neck pain    New Prescriptions New Prescriptions   No medications on file     Verdis PrimeLaw, Louana Fontenot M, PA-C 05/07/17 0805    Derwood KaplanNanavati, Ankit, MD 05/09/17 2310

## 2017-12-25 IMAGING — CR DG THORACIC SPINE 2V
3 series · 3 of 3 positions shown · non-contrast
Comparison: None.

CLINICAL DATA: Patient fell 3 weeks ago. Pain and cracking in the
neck. Previous history of cervical fracture in 3220.

EXAM:
THORACIC SPINE 2 VIEWS

[t thoracic spine ap]
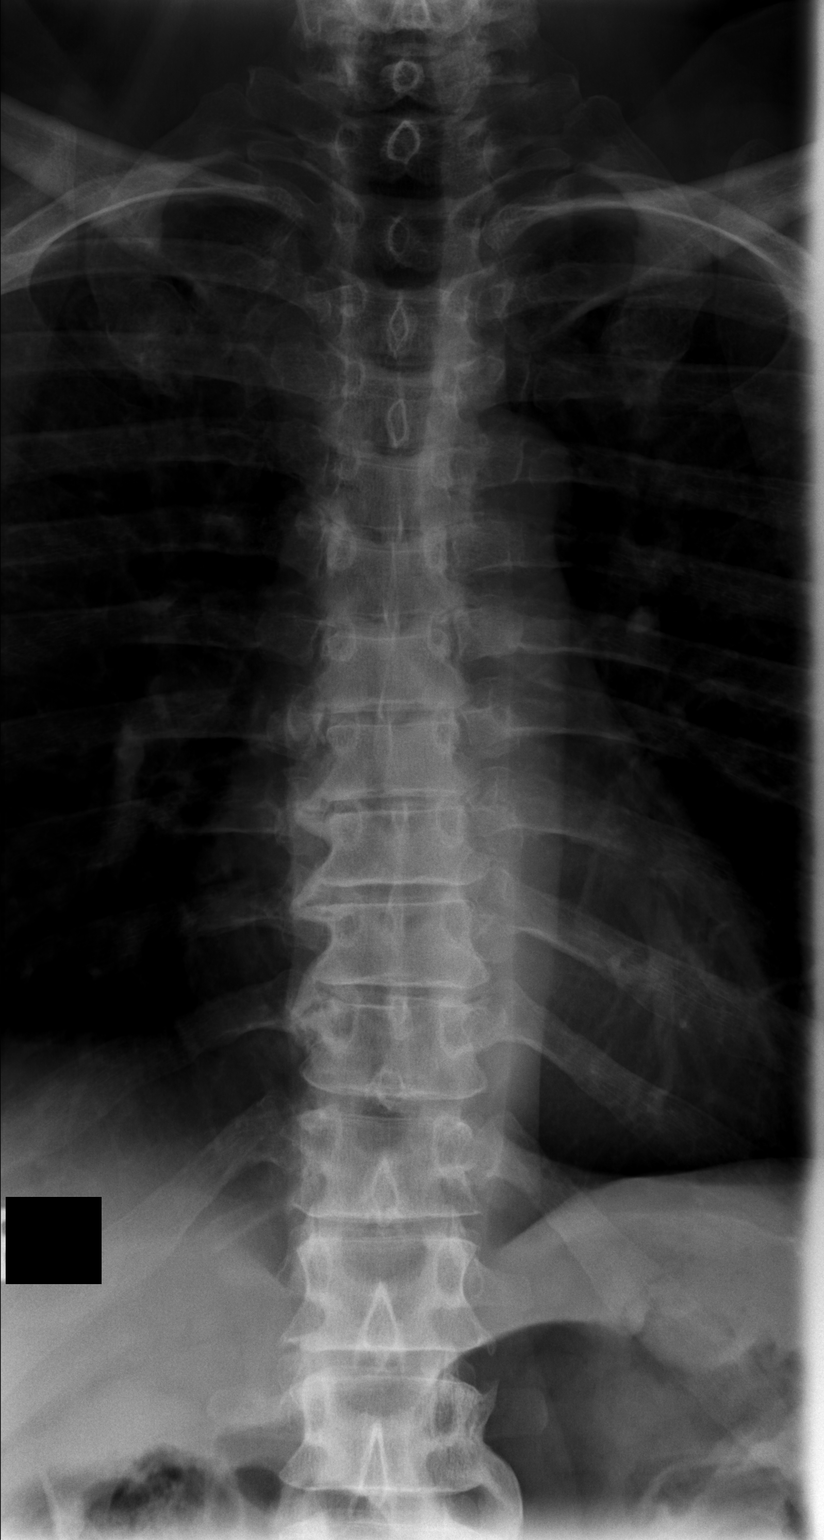

[t thoracic spine lat]
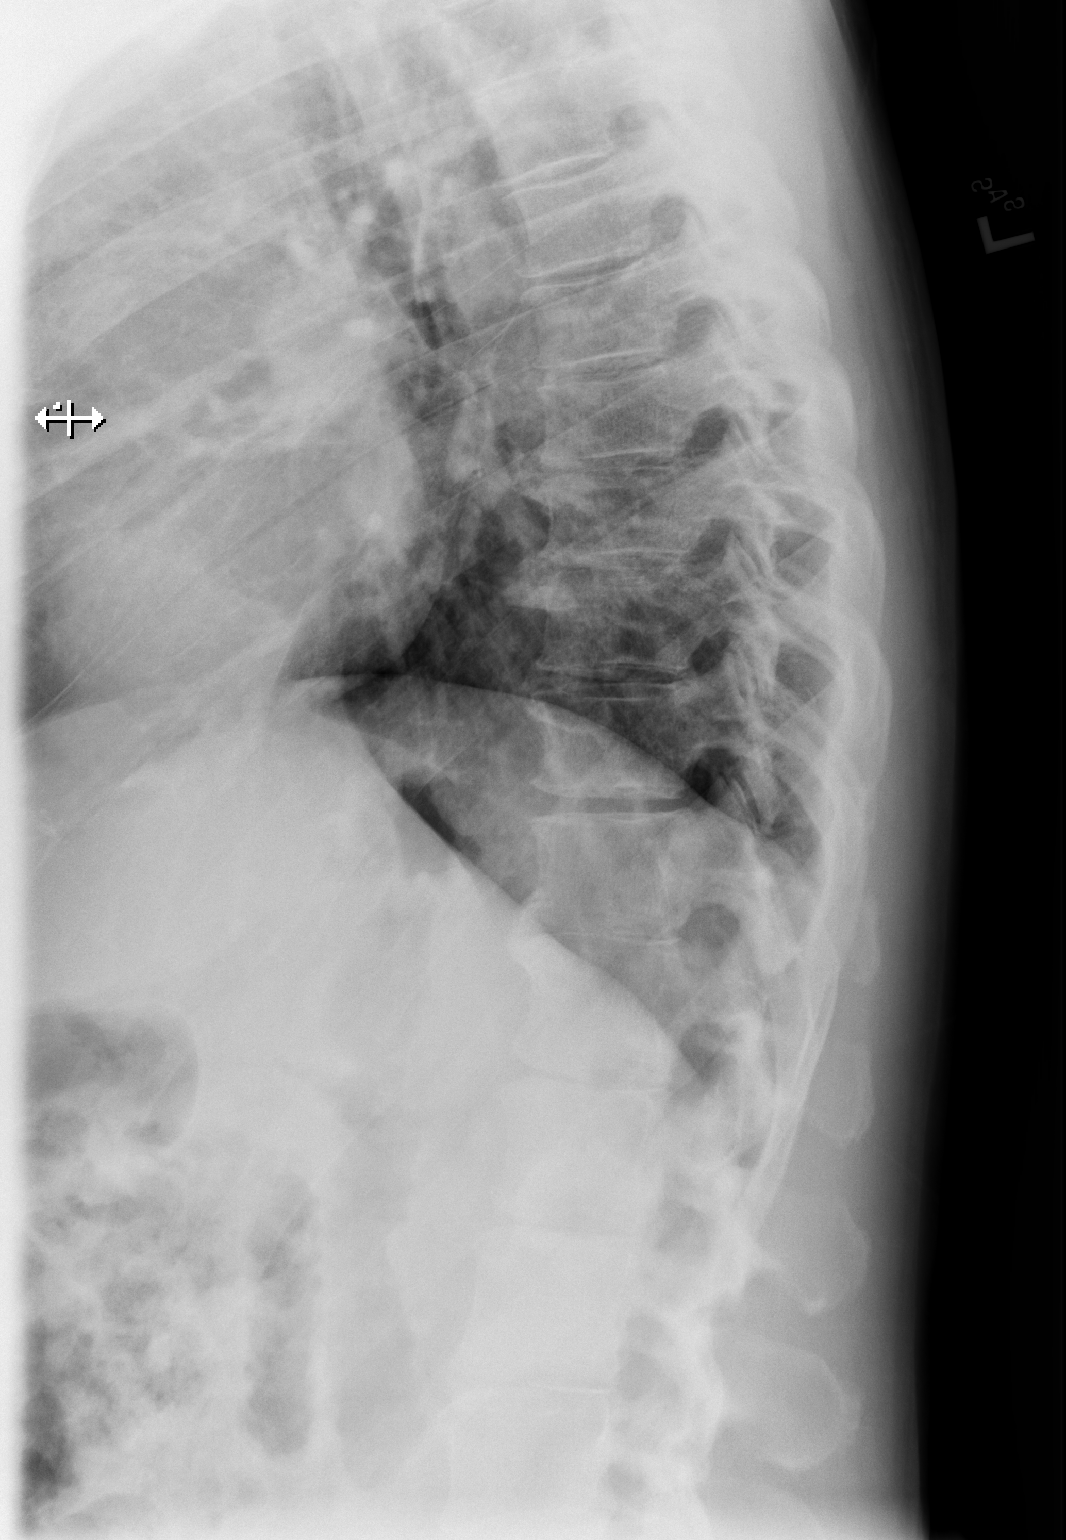

[t thoracic swimmers]
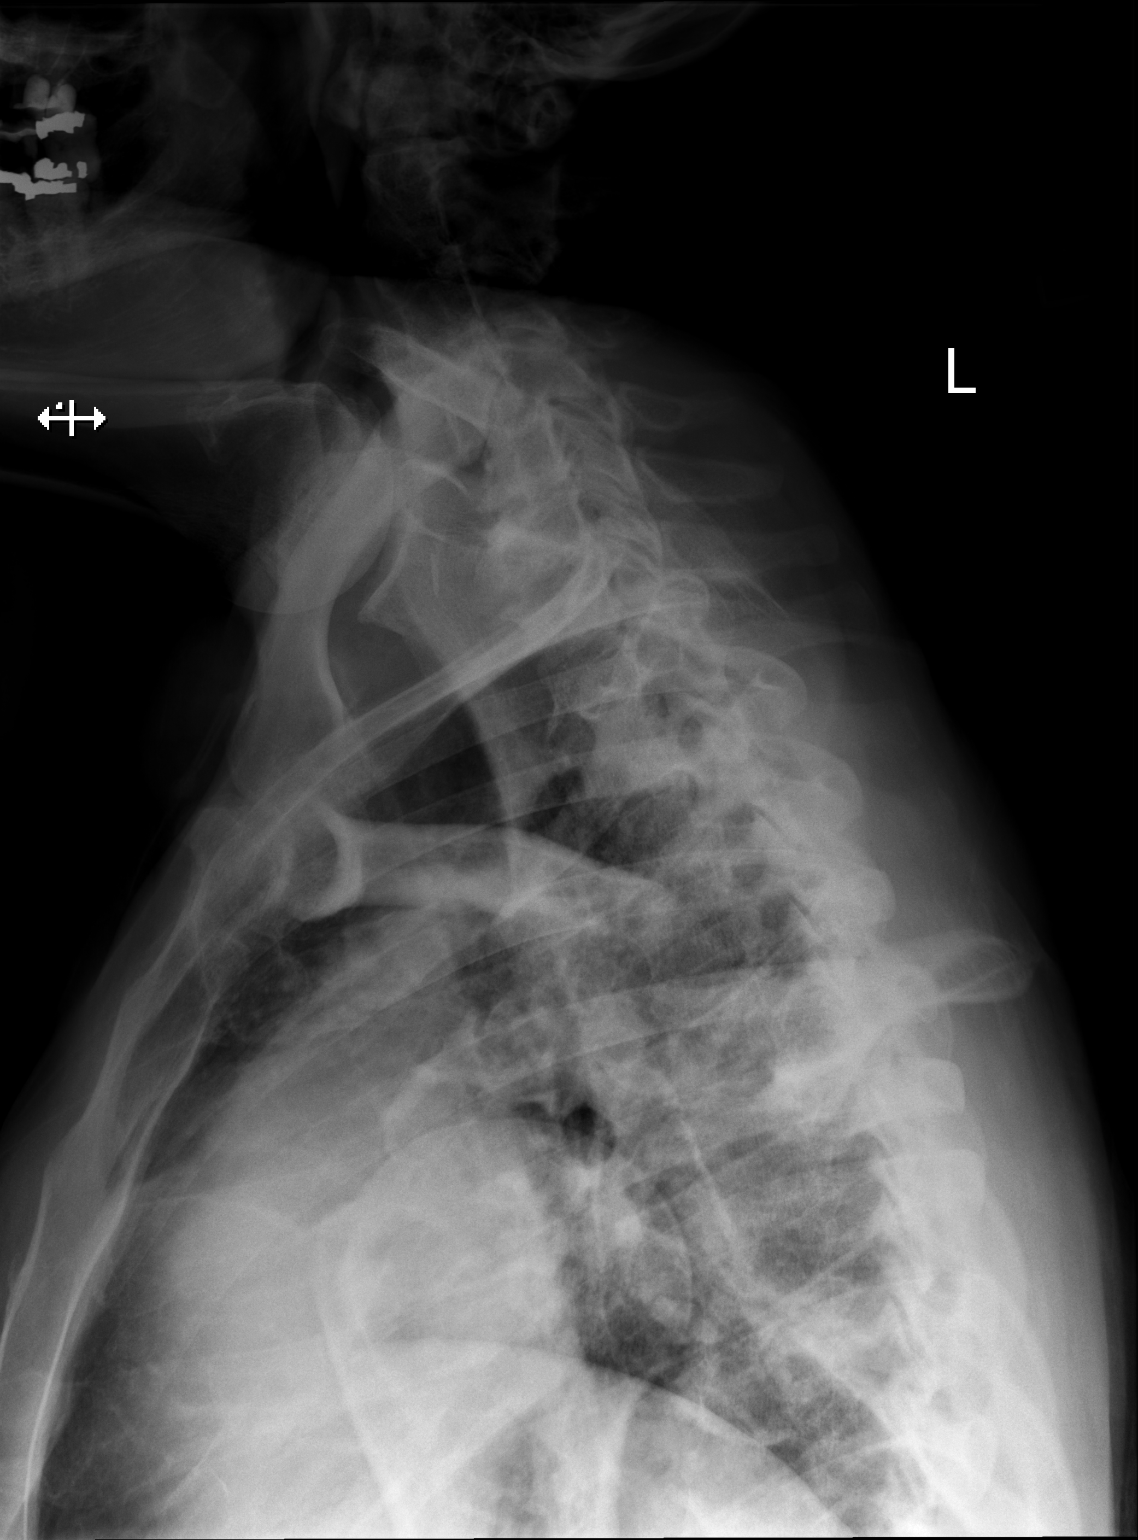

[3 of 3 positions shown; findings below may reference images not displayed]

FINDINGS: Normal alignment of the thoracic spine. Diffuse degenerative changes
with narrowed interspaces and associated endplate hypertrophic
change. No vertebral compression deformities. No paraspinal soft
tissue swelling. Incidental note of moderate anterior subluxation of
C4 on C5. See additional report of CT cervical spine done today.
IMPRESSION: Normal alignment of the thoracic spine. No acute displaced fractures
identified. Diffuse degenerative changes. Incidental note of
anterior subluxation of C4 on C5. See additional report of CT
cervical spine.

## 2019-05-06 ENCOUNTER — Other Ambulatory Visit: Payer: Self-pay

## 2019-05-06 ENCOUNTER — Encounter (HOSPITAL_COMMUNITY): Payer: Self-pay | Admitting: Emergency Medicine

## 2019-05-06 ENCOUNTER — Emergency Department (HOSPITAL_COMMUNITY)
Admission: EM | Admit: 2019-05-06 | Discharge: 2019-05-06 | Disposition: A | Discharge status: Home / Self Care | Payer: BLUE CROSS/BLUE SHIELD | Attending: Emergency Medicine | Admitting: Emergency Medicine

## 2019-05-06 DIAGNOSIS — F17210 Nicotine dependence, cigarettes, uncomplicated: Secondary | ICD-10-CM | POA: Diagnosis not present

## 2019-05-06 DIAGNOSIS — Z79899 Other long term (current) drug therapy: Secondary | ICD-10-CM | POA: Diagnosis not present

## 2019-05-06 DIAGNOSIS — Z760 Encounter for issue of repeat prescription: Secondary | ICD-10-CM | POA: Diagnosis present

## 2019-05-06 MED ORDER — ARIPIPRAZOLE 5 MG PO TABS: 5.0000 mg | ORAL_TABLET | Freq: Every day | ORAL | 1 refills | Status: AC

## 2019-05-06 MED ORDER — SERTRALINE HCL 100 MG PO TABS: 150.0000 mg | ORAL_TABLET | Freq: Every day | ORAL | 1 refills | Status: AC

## 2019-05-06 MED ORDER — TRAZODONE HCL 100 MG PO TABS: 100.0000 mg | ORAL_TABLET | Freq: Every day | ORAL | 1 refills | Status: AC

## 2019-05-06 MED ORDER — LAMOTRIGINE 100 MG PO TABS: 200.0000 mg | ORAL_TABLET | Freq: Two times a day (BID) | ORAL | 1 refills | Status: AC

## 2019-05-06 NOTE — Discharge Instructions (Signed)
Return for any new or worsening symptoms.

## 2019-05-06 NOTE — ED Triage Notes (Signed)
Pt reports that gets his medications filled through [facility] but are closed today and needing 4 meds refilled:  Abilify 5mg  once a day lamictal 100mg  twice a day. Zoloft 100mg  daily

## 2019-05-06 NOTE — ED Provider Notes (Signed)
[facility]   CSN: [id number] Arrival date & time: 05/06/19  1413    History   Chief Complaint Chief Complaint  Patient presents with  . Medication Refill    HPI Edward Mcclain is a 48 y.o. male.      Medication Refill  Medications/supplies requested:  Trazadone, sertraline, lamotrigine, and ariprazole Reason for request:  Clinic/provider not available (Gets psych care at [facility] which is closed for pandemic) Medications taken before: yes - see home medications   Patient has complete original prescription information: yes     Past Medical History:  Diagnosis Date  . Anxiety   . Bipolar disorder (HCC)   . Depression     Patient Active Problem List   Diagnosis Date Noted  . Alcohol dependence with uncomplicated withdrawal (HCC)   . Alcohol use with uncomplicated intoxication (HCC) 10/28/2014  . Alcohol dependence (HCC) 10/28/2014  . S/P alcohol detoxification 08/13/2014    Past Surgical History:  Procedure Laterality Date  . HAND SURGERY    . NECK SURGERY          Home Medications    Prior to Admission medications   Medication Sig Start Date End Date Taking? Authorizing Provider  ARIPiprazole (ABILIFY) 5 MG tablet Take 1 tablet (5 mg total) by mouth daily. 05/06/19   Envi Eagleson, Cammy CopaAbigail, PA-C  gabapentin (NEURONTIN) 300 MG capsule Take 300 mg by mouth 3 (three) times daily.    [provider]  lamoTRIgine (LAMICTAL) 100 MG tablet Take 2 tablets (200 mg total) by mouth 2 (two) times daily. 05/06/19   Arthor CaptainHarris, Aram Domzalski, PA-C  Multiple Vitamin (MULTIVITAMIN WITH MINERALS) TABS tablet Take 1 tablet by mouth daily. Patient not taking: Reported on 05/07/2017 10/29/14   Maurice MarchEisbach, Shelly S, NP  oxyCODONE-acetaminophen (PERCOCET/ROXICET) 5-325 MG tablet Take 1 tablet by mouth every 8 (eight) hours as needed for severe pain. 05/07/17   McDonald, Mia A, PA-C  sertraline (ZOLOFT) 100 MG tablet Take 1.5 tablets (150 mg  total) by mouth daily. 05/06/19   Arthor CaptainHarris, Cambell Rickenbach, PA-C  traZODone (DESYREL) 100 MG tablet Take 1 tablet (100 mg total) by mouth at bedtime. 05/06/19   Arthor CaptainHarris, Taysha Majewski, PA-C    Family History No family history on file.  Social History Social History   Tobacco Use  . Smoking status: Current Every Day Smoker    Packs/day: 1.00    Years: 27.00    Pack years: 27.00    Types: Cigarettes  . Smokeless tobacco: Never Used  Substance Use Topics  . Alcohol use: Yes    Comment: Daily Use 12 to 18 beers per day or 2 bottles of [employer]  . Drug use: No     Allergies   Patient has no known allergies.   Review of Systems Review of Systems  Constitutional: Negative for chills and fever.  Respiratory: Negative for cough.   Psychiatric/Behavioral: Negative for agitation, behavioral problems, confusion, dysphoric mood, self-injury, sleep disturbance and suicidal ideas. The patient is not nervous/anxious.      Physical Exam Updated Vital Signs BP (!) 140/92 (BP Location: Right Arm)   Pulse (!) 112   Temp 98.7 F (37.1 C) (Oral)   Resp 16   SpO2 98%   Physical Exam Vitals signs and nursing note reviewed.  Constitutional:      General: He is not in acute distress.    Appearance: He is well-developed. He is not diaphoretic.  HENT:     Head: Normocephalic and atraumatic.  Eyes:     General: No scleral icterus.    Conjunctiva/sclera: Conjunctivae normal.  Neck:     Musculoskeletal: Normal range of motion and neck supple.  Cardiovascular:     Rate and Rhythm: Normal rate and regular rhythm.     Heart sounds: Normal heart sounds.  Pulmonary:     Effort: Pulmonary effort is normal. No respiratory distress.     Breath sounds: Normal breath sounds.  Abdominal:     Palpations: Abdomen is soft.     Tenderness: There is no abdominal tenderness.  Skin:    General: Skin is warm and dry.  Neurological:     Mental Status: He is alert.  Psychiatric:        Behavior: Behavior  normal.      ED Treatments / Results  Labs (all labs ordered are listed, but only abnormal results are displayed) Labs Reviewed - No data to display  EKG None  Radiology No results found.  Procedures Procedures (including critical care time)  Medications Ordered in ED Medications - No data to display   Initial Impression / Assessment and Plan / ED Course  I have reviewed the triage vital signs and the nursing notes.  Pertinent labs & imaging results that were available during my care of the patient were reviewed by me and considered in my medical decision making (see chart for details).        Pt here for refill of medication. Medication is not a controlled substance. Will refill medication here. Discussed need to follow up with PCP in 2-3 days.  Pt is safe for discharge at this time.   Final Clinical Impressions(s) / ED Diagnoses   Final diagnoses:  Encounter for medication refill    ED Discharge Orders         Ordered    lamoTRIgine (LAMICTAL) 100 MG tablet  2 times daily     05/06/19 1620    traZODone (DESYREL) 100 MG tablet  Daily at bedtime     05/06/19 1620    sertraline (ZOLOFT) 100 MG tablet  Daily     05/06/19 1620    ARIPiprazole (ABILIFY) 5 MG tablet  Daily     05/06/19 1620           Arthor Captain, PA-C 05/06/19 1622    Mancel Bale, MD 05/09/19 [phone]

## 2020-05-18 ENCOUNTER — Telehealth (HOSPITAL_COMMUNITY): Payer: Self-pay | Admitting: Psychiatry
